# Patient Record
Sex: Female | Born: 1955 | Race: White | Hispanic: No | Marital: Married | Smoking: Former smoker
Health system: Southern US, Community
[De-identification: ages and names within clinical notes are randomized; demographics above are authoritative.]

## PROBLEM LIST (undated history)

## (undated) DIAGNOSIS — E119 Type 2 diabetes mellitus without complications: Secondary | ICD-10-CM

## (undated) DIAGNOSIS — K219 Gastro-esophageal reflux disease without esophagitis: Secondary | ICD-10-CM

## (undated) DIAGNOSIS — E785 Hyperlipidemia, unspecified: Secondary | ICD-10-CM

## (undated) DIAGNOSIS — T7840XA Allergy, unspecified, initial encounter: Secondary | ICD-10-CM

## (undated) DIAGNOSIS — M1990 Unspecified osteoarthritis, unspecified site: Secondary | ICD-10-CM

## (undated) HISTORY — DX: Hyperlipidemia, unspecified: E78.5

## (undated) HISTORY — DX: Type 2 diabetes mellitus without complications: E11.9

## (undated) HISTORY — PX: JOINT REPLACEMENT: SHX530

## (undated) HISTORY — DX: Allergy, unspecified, initial encounter: T78.40XA

## (undated) HISTORY — DX: Unspecified osteoarthritis, unspecified site: M19.90

## (undated) HISTORY — DX: Gastro-esophageal reflux disease without esophagitis: K21.9

---

## 2006-03-24 DIAGNOSIS — J309 Allergic rhinitis, unspecified: Secondary | ICD-10-CM | POA: Insufficient documentation

## 2006-05-13 ENCOUNTER — Ambulatory Visit: Payer: Self-pay | Admitting: Family Medicine

## 2006-05-22 DIAGNOSIS — E7800 Pure hypercholesterolemia, unspecified: Secondary | ICD-10-CM | POA: Insufficient documentation

## 2007-07-14 DIAGNOSIS — L989 Disorder of the skin and subcutaneous tissue, unspecified: Secondary | ICD-10-CM | POA: Insufficient documentation

## 2007-07-28 ENCOUNTER — Ambulatory Visit: Payer: Self-pay | Admitting: Family Medicine

## 2008-07-26 DIAGNOSIS — Z720 Tobacco use: Secondary | ICD-10-CM | POA: Insufficient documentation

## 2008-08-09 ENCOUNTER — Ambulatory Visit: Payer: Self-pay | Admitting: Family Medicine

## 2010-10-15 ENCOUNTER — Ambulatory Visit: Payer: Self-pay | Admitting: Family Medicine

## 2011-08-27 LAB — HM PAP SMEAR: HM Pap smear: NEGATIVE

## 2011-10-16 ENCOUNTER — Ambulatory Visit: Payer: Self-pay | Admitting: Family Medicine

## 2012-10-18 ENCOUNTER — Ambulatory Visit: Payer: Self-pay | Admitting: Family Medicine

## 2013-10-28 LAB — LIPID PANEL
CHOLESTEROL: 212 mg/dL — AB (ref 0–200)
HDL: 44 mg/dL (ref 35–70)
LDL CALC: 99 mg/dL
Triglycerides: 343 mg/dL — AB (ref 40–160)

## 2013-10-28 LAB — BASIC METABOLIC PANEL
BUN: 7 mg/dL (ref 4–21)
Creatinine: 0.9 mg/dL (ref 0.5–1.1)
Glucose: 104 mg/dL
POTASSIUM: 4.4 mmol/L (ref 3.4–5.3)
Sodium: 135 mmol/L — AB (ref 137–147)

## 2013-10-28 LAB — HEPATIC FUNCTION PANEL
ALK PHOS: 77 U/L (ref 25–125)
ALT: 36 U/L — AB (ref 7–35)
AST: 35 U/L (ref 13–35)
Bilirubin, Total: 0.6 mg/dL

## 2013-10-28 LAB — CBC AND DIFFERENTIAL
HCT: 41 % (ref 36–46)
Hemoglobin: 13.5 g/dL (ref 12.0–16.0)
NEUTROS ABS: 6 /uL
PLATELETS: 370 10*3/uL (ref 150–399)
WBC: 10.5 10^3/mL

## 2013-10-28 LAB — TSH: TSH: 1.11 u[IU]/mL (ref 0.41–5.90)

## 2013-10-28 LAB — HEMOGLOBIN A1C: HEMOGLOBIN A1C: 6.7 % — AB (ref 4.0–6.0)

## 2013-11-04 ENCOUNTER — Ambulatory Visit: Payer: Self-pay | Admitting: Family Medicine

## 2013-11-04 LAB — HM MAMMOGRAPHY

## 2014-08-07 ENCOUNTER — Other Ambulatory Visit: Payer: Self-pay | Admitting: Family Medicine

## 2014-08-07 DIAGNOSIS — E785 Hyperlipidemia, unspecified: Secondary | ICD-10-CM

## 2014-10-11 ENCOUNTER — Other Ambulatory Visit: Payer: Self-pay | Admitting: Family Medicine

## 2014-10-11 DIAGNOSIS — Z1231 Encounter for screening mammogram for malignant neoplasm of breast: Secondary | ICD-10-CM

## 2014-10-31 ENCOUNTER — Encounter: Payer: Self-pay | Admitting: Emergency Medicine

## 2014-10-31 ENCOUNTER — Ambulatory Visit (INDEPENDENT_AMBULATORY_CARE_PROVIDER_SITE_OTHER): Payer: BC Managed Care – PPO | Admitting: Family Medicine

## 2014-10-31 ENCOUNTER — Encounter: Payer: Self-pay | Admitting: Family Medicine

## 2014-10-31 ENCOUNTER — Other Ambulatory Visit: Payer: Self-pay | Admitting: Family Medicine

## 2014-10-31 VITALS — BP 126/86 | HR 78 | Temp 97.9°F | Resp 16 | Ht 66.0 in | Wt 235.0 lb

## 2014-10-31 DIAGNOSIS — R739 Hyperglycemia, unspecified: Secondary | ICD-10-CM | POA: Insufficient documentation

## 2014-10-31 DIAGNOSIS — G4700 Insomnia, unspecified: Secondary | ICD-10-CM | POA: Insufficient documentation

## 2014-10-31 DIAGNOSIS — E119 Type 2 diabetes mellitus without complications: Secondary | ICD-10-CM | POA: Insufficient documentation

## 2014-10-31 DIAGNOSIS — Z0000 Encounter for general adult medical examination without abnormal findings: Secondary | ICD-10-CM | POA: Diagnosis not present

## 2014-10-31 DIAGNOSIS — E7800 Pure hypercholesterolemia, unspecified: Secondary | ICD-10-CM | POA: Diagnosis not present

## 2014-10-31 DIAGNOSIS — IMO0002 Reserved for concepts with insufficient information to code with codable children: Secondary | ICD-10-CM | POA: Insufficient documentation

## 2014-10-31 DIAGNOSIS — E559 Vitamin D deficiency, unspecified: Secondary | ICD-10-CM | POA: Insufficient documentation

## 2014-10-31 LAB — POCT URINALYSIS DIPSTICK
Bilirubin, UA: NEGATIVE
Blood, UA: NEGATIVE
Glucose, UA: NEGATIVE
Ketones, UA: NEGATIVE
LEUKOCYTES UA: NEGATIVE
Nitrite, UA: NEGATIVE
PROTEIN UA: NEGATIVE
UROBILINOGEN UA: 0.2
pH, UA: 6

## 2014-10-31 LAB — POCT UA - MICROALBUMIN: MICROALBUMIN (UR) POC: NEGATIVE mg/L

## 2014-10-31 NOTE — Progress Notes (Signed)
Patient ID: Madison Mason, female   DOB: 11/04/1955, 59 y.o.   MRN: [id number]        Patient: Madison Mason, Female    DOB: 05/04/1955, 59 y.o.   MRN: [id number] Visit Date: 10/31/2014  Today's Provider: Lorie PhenixNancy Sharnika Binney, MD   Chief Complaint  Patient presents with  . Annual Exam   Subjective:    Annual physical exam Madison Mason is a 59 y.o. female who presents today for health maintenance and complete physical. She feels well. She reports exercising daily. She reports she is sleeping fairly well,6 hours wakes up every hour or 2. Does not think she snores.  Does get up also to urinate.   10/28/13 CPE 08/27/11 Pap-neg; HPV-neg 11/04/13 Mammo-BI-RADS 1 10/28/13 EKG    Results for orders placed or performed in visit on 10/31/14  POCT urinalysis dipstick  Result Value Ref Range   Color, UA straw    Clarity, UA clear    Glucose, UA neg    Bilirubin, UA neg    Ketones, UA neg    Spec Grav, UA <=1.005    Blood, UA neg    pH, UA 6.0    Protein, UA neg    Urobilinogen, UA 0.2    Nitrite, UA neg    Leukocytes, UA Negative Negative    Lab Results  Component Value Date   WBC 10.5 10/28/2013   HGB 13.5 10/28/2013   HCT 41 10/28/2013   PLT 370 10/28/2013   CHOL 212* 10/28/2013   TRIG 343* 10/28/2013   HDL 44 10/28/2013   LDLCALC 99 10/28/2013   ALT 36* 10/28/2013   AST 35 10/28/2013   NA 135* 10/28/2013   K 4.4 10/28/2013   CREATININE 0.9 10/28/2013   BUN 7 10/28/2013   TSH 1.11 10/28/2013   HGBA1C 6.7* 10/28/2013    -----------------------------------------------------------------  Diabetes Mellitus Type II, Follow-up:   Lab Results  Component Value Date   HGBA1C 6.7* 10/28/2013    Last seen for diabetes 1 years ago.  Management since then includes none. She reports fair compliance with treatment. Current symptoms include none and have been unchanged. Home blood sugar records: none  Episodes of hypoglycemia? no   Current Insulin Regimen:  none Most Recent Eye Exam: up to date Weight trend: increasing steadily Prior visit with dietician: no Current diet: in general, an "unhealthy" diet Current exercise: bicycling  Pertinent Labs:    Component Value Date/Time   CHOL 212* 10/28/2013   TRIG 343* 10/28/2013   CREATININE 0.9 10/28/2013    Wt Readings from Last 3 Encounters:  10/31/14 235 lb (106.595 kg)  10/28/13 223 lb (101.152 kg)    ------------------------------------------------------------------------     Review of Systems  Constitutional: Negative.   HENT: Negative.   Eyes: Negative.   Respiratory: Negative.   Cardiovascular: Negative.   Gastrointestinal: Negative.   Endocrine: Negative.   Genitourinary: Negative.   Musculoskeletal: Negative.   Skin: Negative.   Allergic/Immunologic: Positive for environmental allergies.  Neurological: Negative.   Hematological: Negative.   Psychiatric/Behavioral: Positive for sleep disturbance.    Social History She  reports that she quit smoking about 17 years ago. Her smoking use included Cigarettes. She has a 25 pack-year smoking history. She has never used smokeless tobacco. She reports that she drinks alcohol. She reports that she does not use illicit drugs. Social History   Social History  . Marital Status: Married    Spouse Name: N/A  . Number of Children: N/A  .  Years of Education: N/A   Social History Main Topics  . Smoking status: Former Smoker -- 1.00 packs/day for 25 years    Types: Cigarettes    Quit date: 01/05/1997  . Smokeless tobacco: Never Used     Comment: Quit in 1988  . Alcohol Use: Yes     Comment: occasionally  . Drug Use: No  . Sexual Activity: Not Asked   Other Topics Concern  . None   Social History Narrative    Patient Active Problem List   Diagnosis Date Noted  . Blood glucose elevated 10/31/2014  . Cannot sleep 10/31/2014  . Adult BMI 30+ 10/31/2014  . Avitaminosis D 10/31/2014  . Hyperlipemia 08/07/2014  .  Current tobacco use 07/26/2008  . Dermatologic disease 07/14/2007  . Hypercholesteremia 05/22/2006  . Allergic rhinitis 03/24/2006    Past Surgical History  Procedure Laterality Date  . Cesarean section  1989 and 1986    Family History  Family Status  Relation Status Death Age  . Father Alive   . Mother Alive   . Sister Alive   . Sister Alive   . Sister Alive    Her family history includes Cancer in her father; Diabetes in her mother; Healthy in her sister, sister, and sister; Heart disease in her father.    Allergies  Allergen Reactions  . Sulfa Antibiotics Rash    Previous Medications   CHOLECALCIFEROL (VITAMIN D) 1000 UNITS TABLET    Take 1,000 Units by mouth daily.   FAMOTIDINE (PEPCID) 10 MG TABLET    Take 10 mg by mouth daily.   LORATADINE-PSEUDOEPHEDRINE (CLARITIN-D 12 HOUR) 5-120 MG TABLET    CLARITIN-D 12 HOUR, 5-120MG  (Oral Tablet Extended Release 12 Hour)  1 Every Day for 0 days  Quantity: 0.00;  Refills: 0   Ordered :21-Aug-2010  Kavin Leech ;  Started 13-Aug-2009 Active   MISC NATURAL PRODUCTS (OSTEO BI-FLEX ADV TRIPLE ST) TABS    Take 1 tablet by mouth daily.   MULTIPLE VITAMINS-MINERALS PO    Take 1 tablet by mouth daily.    SIMVASTATIN (ZOCOR) 20 MG TABLET    TAKE ONE TABLET BY MOUTH AT BEDTIME    No care team member to display     Objective:   Vitals: BP 126/86 mmHg  Pulse 78  Temp(Src) 97.9 F (36.6 C) (Oral)  Resp 16  Ht  (1.676 m)  Wt 235 lb (106.595 kg)  BMI 37.95 kg/m2  SpO2 98%   Physical Exam  Constitutional: She is oriented to person, place, and time. She appears well-developed and well-nourished.  HENT:  Head: Normocephalic and atraumatic.  Right Ear: Tympanic membrane, external ear and ear canal normal.  Left Ear: Tympanic membrane, external ear and ear canal normal.  Nose: Nose normal.  Mouth/Throat: Uvula is midline, oropharynx is clear and moist and mucous membranes are normal.  Eyes: Conjunctivae, EOM and lids are  normal. Pupils are equal, round, and reactive to light.  Neck: Trachea normal and normal range of motion. Neck supple. Carotid bruit is not present. No thyroid mass and no thyromegaly present.  Cardiovascular: Normal rate, regular rhythm and normal heart sounds.   Pulmonary/Chest: Effort normal and breath sounds normal.  Abdominal: Soft. Normal appearance and bowel sounds are normal. There is no hepatosplenomegaly. There is no tenderness.  Genitourinary: No breast swelling, tenderness or discharge.  Musculoskeletal: Normal range of motion.  Lymphadenopathy:    She has no cervical adenopathy.    She has no axillary adenopathy.  Neurological: She is alert and oriented to person, place, and time. She has normal strength. No cranial nerve deficit.  Skin: Skin is warm, dry and intact.  Psychiatric: She has a normal mood and affect. Her speech is normal and behavior is normal. Judgment and thought content normal. Cognition and memory are normal.     Depression Screen PHQ 2/9 Scores 10/31/2014  PHQ - 2 Score 0      Assessment & Plan:     Routine Health Maintenance and Physical Exam  Exercise Activities and Dietary recommendations Goals    . Exercise 150 minutes per week (moderate activity)       Immunization History  Administered Date(s) Administered  . Tdap 07/14/2007    Health Maintenance  Topic Date Due  . Hepatitis C Screening  15-Sep-1955  . HIV Screening  12/09/1972  . TETANUS/TDAP  12/09/1976  . PAP SMEAR  12/10/1978  . MAMMOGRAM  12/10/2007  . COLONOSCOPY  12/10/2007  . INFLUENZA VACCINE  08/07/2014       1. Annual physical exam Stable. Patient advised to continue eating healthy and exercise daily. - POCT urinalysis dipstick  2. Diabetes mellitus without complication (HCC) Not to goal. F/U pending lab report. Will most likely start Metformin over the phone. Discussed medication and side effects today.  Work on [employer] and exercise and recheck in 3 monts.   - Hemoglobin A1c - TSH - POCT UA - Microalbumin  3. Hypercholesteremia F/U pending lab report. - Comprehensive metabolic panel - Lipid Panel With LDL/HDL Ratio - CBC with Differential/Platelet    Patient seen and examined by Dr. Leo Grosser, and note scribed by Liz Beach. Dimas, CMA.  I have reviewed the document for accuracy and completeness and I agree with above. Leo Grosser, MD    Lorie Phenix, MD   --------------------------------------------------------------------

## 2014-10-31 NOTE — Patient Instructions (Signed)
Please consider joining a formal weight program.

## 2014-11-01 LAB — COMPREHENSIVE METABOLIC PANEL
A/G RATIO: 1.5 (ref 1.1–2.5)
ALBUMIN: 4.6 g/dL (ref 3.5–5.5)
ALK PHOS: 75 IU/L (ref 39–117)
ALT: 62 IU/L — ABNORMAL HIGH (ref 0–32)
AST: 85 IU/L — ABNORMAL HIGH (ref 0–40)
BUN / CREAT RATIO: 17 (ref 9–23)
BUN: 14 mg/dL (ref 6–24)
Bilirubin Total: 0.6 mg/dL (ref 0.0–1.2)
CO2: 25 mmol/L (ref 18–29)
CREATININE: 0.82 mg/dL (ref 0.57–1.00)
Calcium: 10.4 mg/dL — ABNORMAL HIGH (ref 8.7–10.2)
Chloride: 95 mmol/L — ABNORMAL LOW (ref 97–106)
GFR calc Af Amer: 93 mL/min/{1.73_m2} (ref >59)
GFR, EST NON AFRICAN AMERICAN: 80 mL/min/{1.73_m2} (ref >59)
GLOBULIN, TOTAL: 3.1 g/dL (ref 1.5–4.5)
Glucose: 131 mg/dL — ABNORMAL HIGH (ref 65–99)
Potassium: 4.7 mmol/L (ref 3.5–5.2)
SODIUM: 137 mmol/L (ref 136–144)
Total Protein: 7.7 g/dL (ref 6.0–8.5)

## 2014-11-01 LAB — CBC WITH DIFFERENTIAL/PLATELET
BASOS: 1 %
Basophils Absolute: 0.1 10*3/uL (ref 0.0–0.2)
EOS (ABSOLUTE): 0.2 10*3/uL (ref 0.0–0.4)
EOS: 2 %
HEMATOCRIT: 40.7 % (ref 34.0–46.6)
HEMOGLOBIN: 13.6 g/dL (ref 11.1–15.9)
Immature Grans (Abs): 0 10*3/uL (ref 0.0–0.1)
Immature Granulocytes: 0 %
LYMPHS ABS: 3.9 10*3/uL — AB (ref 0.7–3.1)
Lymphs: 43 %
MCH: 30.4 pg (ref 26.6–33.0)
MCHC: 33.4 g/dL (ref 31.5–35.7)
MCV: 91 fL (ref 79–97)
Monocytes Absolute: 0.6 10*3/uL (ref 0.1–0.9)
Monocytes: 6 %
NEUTROS ABS: 4.4 10*3/uL (ref 1.4–7.0)
Neutrophils: 48 %
Platelets: 353 10*3/uL (ref 150–379)
RBC: 4.48 x10E6/uL (ref 3.77–5.28)
RDW: 13 % (ref 12.3–15.4)
WBC: 9.1 10*3/uL (ref 3.4–10.8)

## 2014-11-01 LAB — HEMOGLOBIN A1C
ESTIMATED AVERAGE GLUCOSE: 174 mg/dL
Hgb A1c MFr Bld: 7.7 % — ABNORMAL HIGH (ref 4.8–5.6)

## 2014-11-01 LAB — LIPID PANEL WITH LDL/HDL RATIO
CHOLESTEROL TOTAL: 216 mg/dL — AB (ref 100–199)
HDL: 47 mg/dL (ref >39)
LDL CALC: 116 mg/dL — AB (ref 0–99)
LDL/HDL RATIO: 2.5 ratio (ref 0.0–3.2)
Triglycerides: 264 mg/dL — ABNORMAL HIGH (ref 0–149)
VLDL Cholesterol Cal: 53 mg/dL — ABNORMAL HIGH (ref 5–40)

## 2014-11-01 LAB — TSH: TSH: 1.87 u[IU]/mL (ref 0.450–4.500)

## 2014-11-02 ENCOUNTER — Telehealth: Payer: Self-pay

## 2014-11-02 DIAGNOSIS — E119 Type 2 diabetes mellitus without complications: Secondary | ICD-10-CM

## 2014-11-02 MED ORDER — METFORMIN HCL 500 MG PO TABS: 500.0000 mg | ORAL_TABLET | Freq: Two times a day (BID) | ORAL | Status: DC

## 2014-11-02 NOTE — Telephone Encounter (Signed)
Sent rx.  Labs in 4 weeks and then will decide after that  on ov or wait 3 months.   Thanks.

## 2014-11-02 NOTE — Telephone Encounter (Signed)
Pt advised.  She would like to go ahead and just start a diabetes medication now.  She said that you were going to call one in if her sugar was too high.  Do you still want to see her in four weeks, or can she just get her labs redrawn?  Pharmacy [facility] in [place].   Thanks,   -Vernona RiegerLaura

## 2014-11-02 NOTE — Telephone Encounter (Signed)
-----   Message from Lorie PhenixNancy Maloney, MD sent at 11/01/2014  4:41 PM EDT ----- Blood sugar too high, liver enzymes up and calcium elevated.  Needs ov to address.  Can try to work on diet and exercise and recheck labs in 4 weeks before ov and see if liver improves. May be fatty liver. Important to recheck in follow up. Thanks.

## 2014-11-06 ENCOUNTER — Ambulatory Visit
Admission: RE | Admit: 2014-11-06 | Discharge: 2014-11-06 | Disposition: A | Discharge status: Home / Self Care | Payer: BC Managed Care – PPO | Source: Ambulatory Visit | Attending: Family Medicine | Admitting: Family Medicine

## 2014-11-06 DIAGNOSIS — Z1231 Encounter for screening mammogram for malignant neoplasm of breast: Secondary | ICD-10-CM | POA: Diagnosis present

## 2014-12-29 LAB — HM DIABETES EYE EXAM

## 2015-01-26 ENCOUNTER — Ambulatory Visit (INDEPENDENT_AMBULATORY_CARE_PROVIDER_SITE_OTHER): Payer: BC Managed Care – PPO | Admitting: Family Medicine

## 2015-01-26 ENCOUNTER — Encounter: Payer: Self-pay | Admitting: Family Medicine

## 2015-01-26 VITALS — BP 124/70 | HR 80 | Temp 98.6°F | Resp 16 | Wt 228.0 lb

## 2015-01-26 DIAGNOSIS — E119 Type 2 diabetes mellitus without complications: Secondary | ICD-10-CM | POA: Diagnosis not present

## 2015-01-26 DIAGNOSIS — E785 Hyperlipidemia, unspecified: Secondary | ICD-10-CM | POA: Diagnosis not present

## 2015-01-26 DIAGNOSIS — R748 Abnormal levels of other serum enzymes: Secondary | ICD-10-CM | POA: Diagnosis not present

## 2015-01-26 LAB — POCT UA - MICROALBUMIN: MICROALBUMIN (UR) POC: 20 mg/L

## 2015-01-26 LAB — POCT GLYCOSYLATED HEMOGLOBIN (HGB A1C): Hemoglobin A1C: 7.1

## 2015-01-26 MED ORDER — SIMVASTATIN 20 MG PO TABS: 20.0000 mg | ORAL_TABLET | Freq: Every day | ORAL | Status: DC

## 2015-01-26 MED ORDER — METFORMIN HCL 1000 MG PO TABS: 1000.0000 mg | ORAL_TABLET | Freq: Two times a day (BID) | ORAL | Status: DC

## 2015-01-26 NOTE — Addendum Note (Signed)
Addended by: Leo Grosser on: 01/26/2015 11:18 AM   Modules accepted: Kipp Brood

## 2015-01-26 NOTE — Progress Notes (Addendum)
Patient ID: Madison Mason, female   DOB: [birth year], 60 y.o.   MRN: [id number]         Patient: Madison Mason Female    DOB: 06-Jan-1956   60 y.o.   MRN: [id number] Visit Date: 01/26/2015  Today's Provider: Lorie Phenix, MD   Chief Complaint  Patient presents with  . Hyperlipidemia  . Diabetes   Subjective:    Hyperlipidemia This is a chronic problem. The problem is controlled. Recent lipid tests were reviewed and are high. Exacerbating diseases include diabetes. Pertinent negatives include no chest pain. Current antihyperlipidemic treatment includes statins. There are no compliance problems.  Risk factors for coronary artery disease include diabetes mellitus, dyslipidemia and obesity.  Diabetes She presents for her follow-up diabetic visit. She has type 2 diabetes mellitus. Her disease course has been stable. There are no hypoglycemic associated symptoms. Pertinent negatives for hypoglycemia include no dizziness, headaches, mood changes, nervousness/anxiousness, seizures, sleepiness, speech difficulty, sweats or tremors. Pertinent negatives for diabetes include no blurred vision, no chest pain, no fatigue, no foot paresthesias, no foot ulcerations, no polydipsia, no polyphagia, no polyuria, no visual change, no weakness and no weight loss. Symptoms are stable. Risk factors for coronary artery disease include dyslipidemia, diabetes mellitus and obesity. Current diabetic treatment includes oral agent (monotherapy). She is compliant with treatment all of the time. Her weight is stable. She does not see a podiatrist.Eye exam is current.   Lab Results  Component Value Date   HGBA1C 7.7* 10/31/2014   Lab Results  Component Value Date   CHOL 216* 10/31/2014   HDL 47 10/31/2014   LDLCALC 116* 10/31/2014   TRIG 264* 10/31/2014        Allergies  Allergen Reactions  . Sulfa Antibiotics Rash   Previous Medications   CHOLECALCIFEROL (VITAMIN D) 1000 UNITS TABLET    Take 1,000 Units  by mouth daily.   FAMOTIDINE (PEPCID) 10 MG TABLET    Take 10 mg by mouth daily.   LORATADINE-PSEUDOEPHEDRINE (CLARITIN-D 12 HOUR) 5-120 MG TABLET    CLARITIN-D 12 HOUR, 5-120MG  (Oral Tablet Extended Release 12 Hour)  1 Every Day for 0 days  Quantity: 0.00;  Refills: 0   Ordered :21-Aug-2010  Kavin Leech ;  Started 13-Aug-2009 Active   METFORMIN (GLUCOPHAGE) 500 MG TABLET    Take 1 tablet (500 mg total) by mouth 2 (two) times daily with a meal.   MISC NATURAL PRODUCTS (OSTEO BI-FLEX ADV TRIPLE ST) TABS    Take 1 tablet by mouth daily.   MULTIPLE VITAMINS-MINERALS PO    Take 1 tablet by mouth daily.     Review of Systems  Constitutional: Negative.  Negative for weight loss and fatigue.  Eyes: Negative for blurred vision.  Respiratory: Negative.   Cardiovascular: Negative.  Negative for chest pain.  Gastrointestinal: Negative.   Endocrine: Negative for polydipsia, polyphagia and polyuria.  Musculoskeletal: Negative.   Neurological: Negative for dizziness, tremors, seizures, speech difficulty, weakness, light-headedness and headaches.  Psychiatric/Behavioral: The patient is not nervous/anxious.     Social History  Substance Use Topics  . Smoking status: Former Smoker -- 1.00 packs/day for 25 years    Types: Cigarettes    Quit date: 01/05/1997  . Smokeless tobacco: Never Used     Comment: Quit in 1988  . Alcohol Use: Yes     Comment: occasionally   Objective:   BP 124/70 mmHg  Pulse 80  Temp(Src) 98.6 F (37 C) (Oral)  Resp 16  Wt 228 lb (  103.42 kg)  Results for orders placed or performed in visit on 01/26/15  POCT glycosylated hemoglobin (Hb A1C)  Result Value Ref Range   Hemoglobin A1C 7.1   POCT UA - Microalbumin  Result Value Ref Range   Microalbumin Ur, POC 20 mg/L    Physical Exam  Constitutional: She is oriented to person, place, and time. She appears well-developed and well-nourished.  Cardiovascular: Normal rate, regular rhythm and normal heart sounds.     Pulmonary/Chest: Effort normal and breath sounds normal.  Neurological: She is alert and oriented to person, place, and time.  Psychiatric: She has a normal mood and affect. Her behavior is normal. Judgment and thought content normal.        Assessment & Plan:       1. Diabetes mellitus without complication (HCC) A1C improved at 7.1%, but not to goal.  Pt tolerates Metformin well, will increase to 1,000 mg twice a day.  Recheck in three months.  Will consider Pneumonia shot.   - POCT glycosylated hemoglobin (Hb A1C) - POCT UA - Microalbumin - metFORMIN (GLUCOPHAGE) 1000 MG tablet; Take 1 tablet (1,000 mg total) by mouth 2 (two) times daily with a meal.  Dispense: 180 tablet; Refill: 1 Results for orders placed or performed in visit on 01/26/15  POCT glycosylated hemoglobin (Hb A1C)  Result Value Ref Range   Hemoglobin A1C 7.1   POCT UA - Microalbumin  Result Value Ref Range   Microalbumin Ur, POC 20 mg/L    2. Hyperlipemia Stable, refills provided.  - simvastatin (ZOCOR) 20 MG tablet; Take 1 tablet (20 mg total) by mouth at bedtime.  Dispense: 90 tablet; Refill: 1  3. Elevated liver enzymes Will recheck today.  - Comprehensive metabolic panel      Patient was seen and examined by Leo Grosser, MD, and note scribed by Kavin Leech, CMA.  I have reviewed the document for accuracy and completeness and I agree with above. - Leo Grosser, MD    Lorie Phenix, MD  [facility]

## 2015-01-27 LAB — COMPREHENSIVE METABOLIC PANEL
A/G RATIO: 1.5 (ref 1.1–2.5)
ALT: 46 IU/L — AB (ref 0–32)
AST: 64 IU/L — AB (ref 0–40)
Albumin: 4.6 g/dL (ref 3.5–5.5)
Alkaline Phosphatase: 70 IU/L (ref 39–117)
BILIRUBIN TOTAL: 0.3 mg/dL (ref 0.0–1.2)
BUN/Creatinine Ratio: 17 (ref 9–23)
BUN: 12 mg/dL (ref 6–24)
CHLORIDE: 101 mmol/L (ref 96–106)
CO2: 25 mmol/L (ref 18–29)
Calcium: 10 mg/dL (ref 8.7–10.2)
Creatinine, Ser: 0.7 mg/dL (ref 0.57–1.00)
GFR calc Af Amer: 111 mL/min/{1.73_m2} (ref >59)
GFR calc non Af Amer: 96 mL/min/{1.73_m2} (ref >59)
Globulin, Total: 3 g/dL (ref 1.5–4.5)
Glucose: 111 mg/dL — ABNORMAL HIGH (ref 65–99)
POTASSIUM: 4.9 mmol/L (ref 3.5–5.2)
Sodium: 142 mmol/L (ref 134–144)
Total Protein: 7.6 g/dL (ref 6.0–8.5)

## 2015-01-29 ENCOUNTER — Ambulatory Visit: Payer: BC Managed Care – PPO | Admitting: Family Medicine

## 2015-01-29 ENCOUNTER — Telehealth: Payer: Self-pay

## 2015-01-29 NOTE — Telephone Encounter (Signed)
Advised pt of lab results. Pt refuses GI referral at this time. Please advise. Allene Dillon, CMA

## 2015-01-29 NOTE — Telephone Encounter (Signed)
-----   Message from Lorie Phenix, MD sent at 01/27/2015  9:36 AM EST ----- Liver enzymes still elevated. Not terribly high, but enough to warrant a work up.   Recommend referral to GI to evaluate and treat.   Thanks.

## 2015-01-29 NOTE — Telephone Encounter (Signed)
Recheck met c in one month. Thanks.

## 2015-01-30 NOTE — Telephone Encounter (Signed)
Pt advised.   Thanks,   -Timera Windt  

## 2015-01-30 NOTE — Telephone Encounter (Signed)
LMTCB 01/30/2015  Thanks,   -Tierre Gerard  

## 2015-04-27 ENCOUNTER — Ambulatory Visit (INDEPENDENT_AMBULATORY_CARE_PROVIDER_SITE_OTHER): Payer: BC Managed Care – PPO | Admitting: Family Medicine

## 2015-04-27 ENCOUNTER — Encounter: Payer: Self-pay | Admitting: Family Medicine

## 2015-04-27 VITALS — BP 122/78 | Temp 97.7°F | Resp 16 | Wt 220.0 lb

## 2015-04-27 DIAGNOSIS — E119 Type 2 diabetes mellitus without complications: Secondary | ICD-10-CM

## 2015-04-27 DIAGNOSIS — Z23 Encounter for immunization: Secondary | ICD-10-CM | POA: Diagnosis not present

## 2015-04-27 DIAGNOSIS — E7800 Pure hypercholesterolemia, unspecified: Secondary | ICD-10-CM

## 2015-04-27 DIAGNOSIS — E785 Hyperlipidemia, unspecified: Secondary | ICD-10-CM

## 2015-04-27 DIAGNOSIS — R945 Abnormal results of liver function studies: Secondary | ICD-10-CM

## 2015-04-27 DIAGNOSIS — R7989 Other specified abnormal findings of blood chemistry: Secondary | ICD-10-CM | POA: Diagnosis not present

## 2015-04-27 LAB — POCT GLYCOSYLATED HEMOGLOBIN (HGB A1C)
ESTIMATED AVERAGE GLUCOSE: 137
Hemoglobin A1C: 6.4

## 2015-04-27 MED ORDER — METFORMIN HCL 1000 MG PO TABS: 1000.0000 mg | ORAL_TABLET | Freq: Two times a day (BID) | ORAL | Status: DC

## 2015-04-27 MED ORDER — SIMVASTATIN 20 MG PO TABS: 20.0000 mg | ORAL_TABLET | Freq: Every day | ORAL | Status: DC

## 2015-04-27 NOTE — Progress Notes (Signed)
Patient ID: Madison Mason, female   DOB: 08/01/1955, 60 y.o.   MRN: [id number]       Patient: Madison Mason Female    DOB: 03/12/1955   60 y.o.   MRN: [id number] Visit Date: 04/27/2015  Today's Provider: Lorie PhenixNancy Stephania Macfarlane, MD   Chief Complaint  Patient presents with  . Diabetes  . Hyperlipidemia   Subjective:    Diabetes She presents for her follow-up diabetic visit. She has type 2 diabetes mellitus. Her disease course has been stable. There are no hypoglycemic associated symptoms. There are no diabetic associated symptoms. Pertinent negatives for diabetes include no chest pain. There are no hypoglycemic complications. Symptoms are stable. Current diabetic treatment includes oral agent (monotherapy). She is compliant with treatment all of the time. Her weight is decreasing steadily (patient has lost 8lbs since last OV).  Hyperlipidemia Pertinent negatives include no chest pain, focal sensory loss, focal weakness, leg pain, myalgias or shortness of breath. Current antihyperlipidemic treatment includes statins. There are no compliance problems.    Elevated Liver Enzymes Patient reports that she declined going to see a GI doctor to have this evaluated.     Allergies  Allergen Reactions  . Sulfa Antibiotics Rash   Previous Medications   CHOLECALCIFEROL (VITAMIN D) 1000 UNITS TABLET    Take 1,000 Units by mouth daily.   FAMOTIDINE (PEPCID) 10 MG TABLET    Take 10 mg by mouth daily.   LORATADINE-PSEUDOEPHEDRINE (CLARITIN-D 12 HOUR) 5-120 MG TABLET    CLARITIN-D 12 HOUR, 5-120MG  (Oral Tablet Extended Release 12 Hour)  1 Every Day for 0 days  Quantity: 0.00;  Refills: 0   Ordered :21-Aug-2010  Kavin LeechWalsh, Laura ;  Started 13-Aug-2009 Active   MISC NATURAL PRODUCTS (OSTEO BI-FLEX ADV TRIPLE ST) TABS    Take 1 tablet by mouth daily.   MULTIPLE VITAMINS-MINERALS PO    Take 1 tablet by mouth daily.     Review of Systems  Constitutional: Negative.   Respiratory: Negative.  Negative for  shortness of breath.   Cardiovascular: Negative.  Negative for chest pain.  Endocrine: Negative.   Musculoskeletal: Negative.  Negative for myalgias.  Neurological: Negative for focal weakness.    Social History  Substance Use Topics  . Smoking status: Former Smoker -- 1.00 packs/day for 25 years    Types: Cigarettes    Quit date: 01/05/1997  . Smokeless tobacco: Never Used     Comment: Quit in 1988  . Alcohol Use: Yes     Comment: occasionally   Objective:   BP 122/78 mmHg  Temp(Src) 97.7 F (36.5 C)  Resp 16  Wt 220 lb (99.791 kg)  Physical Exam  Constitutional: She is oriented to person, place, and time. She appears well-developed and well-nourished.  HENT:  Head: Normocephalic and atraumatic.  Right Ear: External ear normal.  Left Ear: External ear normal.  Cardiovascular: Normal rate, regular rhythm and normal heart sounds.   Pulmonary/Chest: Effort normal and breath sounds normal.  Neurological: She is alert and oriented to person, place, and time.  Psychiatric: She has a normal mood and affect. Her behavior is normal. Judgment and thought content normal.        Assessment & Plan:     1. Diabetes mellitus without complication (HCC) Improved. Continue diet and exercise. - POCT glycosylated hemoglobin (Hb A1C) - metFORMIN (GLUCOPHAGE) 1000 MG tablet; Take 1 tablet (1,000 mg total) by mouth 2 (two) times daily with a meal.  Dispense: 180 tablet; Refill: 3  2.  Elevated LFTs F/U pending labs.  - Comprehensive metabolic panel  3. Hyperlipemia Stable. Refill as below. Patient is to F/U with [facility] in the summer.  - simvastatin (ZOCOR) 20 MG tablet; Take 1 tablet (20 mg total) by mouth at bedtime.  Dispense: 90 tablet; Refill: 3  4. Need for pneumococcal vaccination Patient declined pneumonia vaccine. Explained benefits vs. risks to patient. Patient reports that she will consider in the future.      Patient was seen and examined by Leo Grosser, MD,  and scribed by Anson [place], CMA.  I have reviewed the document for accuracy and completeness and I agree with above. - Leo Grosser, MD   Lorie Phenix, MD  [facility]

## 2015-04-28 ENCOUNTER — Telehealth: Payer: Self-pay

## 2015-04-28 DIAGNOSIS — E875 Hyperkalemia: Secondary | ICD-10-CM

## 2015-04-28 LAB — COMPREHENSIVE METABOLIC PANEL
ALBUMIN: 4.6 g/dL (ref 3.5–5.5)
ALK PHOS: 72 IU/L (ref 39–117)
ALT: 45 IU/L — ABNORMAL HIGH (ref 0–32)
AST: 61 IU/L — ABNORMAL HIGH (ref 0–40)
Albumin/Globulin Ratio: 1.4 (ref 1.2–2.2)
BILIRUBIN TOTAL: 0.5 mg/dL (ref 0.0–1.2)
BUN / CREAT RATIO: 10 (ref 9–23)
BUN: 8 mg/dL (ref 6–24)
CHLORIDE: 96 mmol/L (ref 96–106)
CO2: 25 mmol/L (ref 18–29)
CREATININE: 0.81 mg/dL (ref 0.57–1.00)
Calcium: 10.8 mg/dL — ABNORMAL HIGH (ref 8.7–10.2)
GFR calc non Af Amer: 81 mL/min/{1.73_m2} (ref >59)
GFR, EST AFRICAN AMERICAN: 93 mL/min/{1.73_m2} (ref >59)
GLOBULIN, TOTAL: 3.2 g/dL (ref 1.5–4.5)
Glucose: 99 mg/dL (ref 65–99)
Potassium: 5.5 mmol/L — ABNORMAL HIGH (ref 3.5–5.2)
SODIUM: 139 mmol/L (ref 134–144)
TOTAL PROTEIN: 7.8 g/dL (ref 6.0–8.5)

## 2015-04-28 NOTE — Telephone Encounter (Signed)
Patient advised as below.  

## 2015-04-28 NOTE — Telephone Encounter (Signed)
-----   Message from Lorie PhenixNancy Maloney, MD sent at 04/28/2015  9:21 AM EDT ----- Potassium and calcium off. Unclear if lab error.  Please have patient stay well hydrated and recheck sometime this week. Thanks.

## 2015-08-27 ENCOUNTER — Ambulatory Visit (INDEPENDENT_AMBULATORY_CARE_PROVIDER_SITE_OTHER): Payer: BC Managed Care – PPO | Admitting: Physician Assistant

## 2015-08-27 ENCOUNTER — Encounter: Payer: Self-pay | Admitting: Physician Assistant

## 2015-08-27 VITALS — BP 112/78 | HR 58 | Temp 97.7°F | Resp 14 | Wt 221.8 lb

## 2015-08-27 DIAGNOSIS — R945 Abnormal results of liver function studies: Secondary | ICD-10-CM

## 2015-08-27 DIAGNOSIS — R7989 Other specified abnormal findings of blood chemistry: Secondary | ICD-10-CM

## 2015-08-27 DIAGNOSIS — E7800 Pure hypercholesterolemia, unspecified: Secondary | ICD-10-CM | POA: Diagnosis not present

## 2015-08-27 DIAGNOSIS — E119 Type 2 diabetes mellitus without complications: Secondary | ICD-10-CM

## 2015-08-27 NOTE — Patient Instructions (Signed)
Diabetes and Exercise Exercising regularly is important. It is not just about losing weight. It has many health benefits, such as:  Improving your overall fitness, flexibility, and endurance.  Increasing your bone density.  Helping with weight control.  Decreasing your body fat.  Increasing your muscle strength.  Reducing stress and tension.  Improving your overall health. People with diabetes who exercise gain additional benefits because exercise:  Reduces appetite.  Improves the body's use of blood sugar (glucose).  Helps lower or control blood glucose.  Decreases blood pressure.  Helps control blood lipids (such as cholesterol and triglycerides).  Improves the body's use of the hormone insulin by:  Increasing the body's insulin sensitivity.  Reducing the body's insulin needs.  Decreases the risk for heart disease because exercising:  Lowers cholesterol and triglycerides levels.  Increases the levels of good cholesterol (such as high-density lipoproteins [HDL]) in the body.  Lowers blood glucose levels. YOUR ACTIVITY PLAN  Choose an activity that you enjoy, and set realistic goals. To exercise safely, you should begin practicing any new physical activity slowly, and gradually increase the intensity of the exercise over time. Your health care provider or diabetes educator can help create an activity plan that works for you. General recommendations include:  Encouraging children to engage in at least 60 minutes of physical activity each day.  Stretching and performing strength training exercises, such as yoga or weight lifting, at least 2 times per week.  Performing a total of at least 150 minutes of moderate-intensity exercise each week, such as brisk walking or water aerobics.  Exercising at least 3 days per week, making sure you allow no more than 2 consecutive days to pass without exercising.  Avoiding long periods of inactivity (90 minutes or more). When you  have to spend an extended period of time sitting down, take frequent breaks to walk or stretch. RECOMMENDATIONS FOR EXERCISING WITH TYPE 1 OR TYPE 2 DIABETES   Check your blood glucose before exercising. If blood glucose levels are greater than 240 mg/dL, check for urine ketones. Do not exercise if ketones are present.  Avoid injecting insulin into areas of the body that are going to be exercised. For example, avoid injecting insulin into:  The arms when playing tennis.  The legs when jogging.  Keep a record of:  Food intake before and after you exercise.  Expected peak times of insulin action.  Blood glucose levels before and after you exercise.  The type and amount of exercise you have done.  Review your records with your health care provider. Your health care provider will help you to develop guidelines for adjusting food intake and insulin amounts before and after exercising.  If you take insulin or oral hypoglycemic agents, watch for signs and symptoms of hypoglycemia. They include:  Dizziness.  Shaking.  Sweating.  Chills.  Confusion.  Drink plenty of water while you exercise to prevent dehydration or heat stroke. Body water is lost during exercise and must be replaced.  Talk to your health care provider before starting an exercise program to make sure it is safe for you. Remember, almost any type of activity is better than none.   This information is not intended to replace advice given to you by your health care provider. Make sure you discuss any questions you have with your health care provider.   Document Released: 03/15/2003 Document Revised: 05/09/2014 Document Reviewed: 06/01/2012 Elsevier Interactive Patient Education 2016 [employer]  

## 2015-08-27 NOTE — Progress Notes (Signed)
Patient: Madison Mason Female    DOB: 08/25/1955   60 y.o.   MRN: [id number] Visit Date: 08/27/2015  Today's Provider: Margaretann LovelessJennifer M Burnette, PA-C   Chief Complaint  Patient presents with  . Hyperlipidemia  . Diabetes  . Follow-up   Subjective:    HPI  Diabetes Mellitus Type II, Follow-up:   Lab Results  Component Value Date   HGBA1C 6.4 04/27/2015   HGBA1C 7.1 01/26/2015   HGBA1C 7.7 (H) 10/31/2014   Last seen for diabetes 4 months ago.  Management since then includes continue Metformin 1000 mg. She reports good compliance with treatment. She is not having side effects.  Current symptoms include none and have been stable. Home blood sugar records: not being checked     Current Insulin Regimen: none Weight trend: stable Current diet: well balanced Current exercise: cardiovascular workout on exercise equipment and walking  ------------------------------------------------------------------------    Lipid/Cholesterol, Follow-up:   Last seen for this 4 months ago.  Management since that visit includes continue Simvastatin 20 mg.  Last Lipid Panel:    Component Value Date/Time   CHOL 216 (H) 10/31/2014 1127   TRIG 264 (H) 10/31/2014 1127   HDL 47 10/31/2014 1127   LDLCALC 116 (H) 10/31/2014 1127    She reports good compliance with treatment. She is not having side effects.   Wt Readings from Last 3 Encounters:  08/27/15 221 lb 12.8 oz (100.6 kg)  04/27/15 220 lb (99.8 kg)  01/26/15 228 lb (103.4 kg)    ------------------------------------------------------------------------    Previous Medications   CHOLECALCIFEROL (VITAMIN D) 1000 UNITS TABLET    Take 1,000 Units by mouth daily.   FAMOTIDINE (PEPCID) 10 MG TABLET    Take 10 mg by mouth daily.   LORATADINE-PSEUDOEPHEDRINE (CLARITIN-D 12 HOUR) 5-120 MG TABLET    CLARITIN-D 12 HOUR, 5-120MG  (Oral Tablet Extended Release 12 Hour)  1 Every Day for 0 days  Quantity: 0.00;  Refills: 0   Ordered  :21-Aug-2010  Kavin LeechWalsh, Laura ;  Started 13-Aug-2009 Active   METFORMIN (GLUCOPHAGE) 1000 MG TABLET    Take 1 tablet (1,000 mg total) by mouth 2 (two) times daily with a meal.   MISC NATURAL PRODUCTS (OSTEO BI-FLEX ADV TRIPLE ST) TABS    Take 1 tablet by mouth daily.   MULTIPLE VITAMINS-MINERALS PO    Take 1 tablet by mouth daily.    SIMVASTATIN (ZOCOR) 20 MG TABLET    Take 1 tablet (20 mg total) by mouth at bedtime.    Review of Systems  Constitutional: Negative.   Respiratory: Negative.   Cardiovascular: Negative.   Gastrointestinal: Negative.   Psychiatric/Behavioral: Negative.     Social History  Substance Use Topics  . Smoking status: Former Smoker    Packs/day: 1.00    Years: 25.00    Types: Cigarettes    Quit date: 01/05/1997  . Smokeless tobacco: Never Used     Comment: Quit in 1988  . Alcohol use Yes     Comment: occasionally   Objective:   BP 112/78 (BP Location: Right Arm, Patient Position: Sitting, Cuff Size: Large)   Pulse (!) 58   Temp 97.7 F (36.5 C) (Oral)   Resp 14   Wt 221 lb 12.8 oz (100.6 kg)   BMI 35.80 kg/m   Physical Exam  Constitutional: She appears well-developed and well-nourished. No distress.  Neck: Normal range of motion. Neck supple. No JVD present. No tracheal deviation present. No thyromegaly present.  Cardiovascular: Normal rate, regular  rhythm and normal heart sounds.  Exam reveals no gallop and no friction rub.   No murmur heard. Pulmonary/Chest: Effort normal and breath sounds normal. No respiratory distress. She has no wheezes. She has no rales.  Musculoskeletal: She exhibits no edema.  Lymphadenopathy:    She has no cervical adenopathy.  Skin: She is not diaphoretic.  Vitals reviewed.     Assessment & Plan:     1. Diabetes mellitus without complication (HCC) Continue metformin 1000mg  BID. Will recheck HgBA1c as below. I will f/u pending results. If stable will see her back in late October 2017 for her CPE. She is to call if she  has any issues in the meantime.  - CBC with Differential - HgB A1c - Comprehensive Metabolic Panel (CMET)  2. Hypercholesteremia Continue simvastatin 20mg . Will check labs as below and f/u pending results. - Lipid panel  3. Elevated LFTs Previously elevated. At last check she denied wanting work up. No imaging done. Will recheck and f/u pending labs. If stable patient may continue to monitor labs. If increased she states she may agree to work up.  - Comprehensive Metabolic Panel (CMET)   Follow up: No Follow-up on file.

## 2015-08-28 ENCOUNTER — Telehealth: Payer: Self-pay

## 2015-08-28 LAB — CBC WITH DIFFERENTIAL/PLATELET
BASOS: 1 %
Basophils Absolute: 0 10*3/uL (ref 0.0–0.2)
EOS (ABSOLUTE): 0.3 10*3/uL (ref 0.0–0.4)
EOS: 3 %
Hematocrit: 38.8 % (ref 34.0–46.6)
Hemoglobin: 13 g/dL (ref 11.1–15.9)
IMMATURE GRANS (ABS): 0 10*3/uL (ref 0.0–0.1)
IMMATURE GRANULOCYTES: 0 %
LYMPHS: 40 %
Lymphocytes Absolute: 3.5 10*3/uL — ABNORMAL HIGH (ref 0.7–3.1)
MCH: 30.1 pg (ref 26.6–33.0)
MCHC: 33.5 g/dL (ref 31.5–35.7)
MCV: 90 fL (ref 79–97)
Monocytes Absolute: 0.4 10*3/uL (ref 0.1–0.9)
Monocytes: 4 %
NEUTROS PCT: 52 %
Neutrophils Absolute: 4.5 10*3/uL (ref 1.4–7.0)
PLATELETS: 327 10*3/uL (ref 150–379)
RBC: 4.32 x10E6/uL (ref 3.77–5.28)
RDW: 13.8 % (ref 12.3–15.4)
WBC: 8.8 10*3/uL (ref 3.4–10.8)

## 2015-08-28 LAB — COMPREHENSIVE METABOLIC PANEL
ALK PHOS: 61 IU/L (ref 39–117)
ALT: 35 IU/L — AB (ref 0–32)
AST: 38 IU/L (ref 0–40)
Albumin/Globulin Ratio: 1.5 (ref 1.2–2.2)
Albumin: 4.4 g/dL (ref 3.5–5.5)
BUN/Creatinine Ratio: 11 (ref 9–23)
BUN: 8 mg/dL (ref 6–24)
Bilirubin Total: 0.5 mg/dL (ref 0.0–1.2)
CALCIUM: 10.3 mg/dL — AB (ref 8.7–10.2)
CO2: 24 mmol/L (ref 18–29)
CREATININE: 0.76 mg/dL (ref 0.57–1.00)
Chloride: 96 mmol/L (ref 96–106)
GFR calc Af Amer: 101 mL/min/{1.73_m2} (ref >59)
GFR calc non Af Amer: 87 mL/min/{1.73_m2} (ref >59)
GLUCOSE: 100 mg/dL — AB (ref 65–99)
Globulin, Total: 2.9 g/dL (ref 1.5–4.5)
Potassium: 4.7 mmol/L (ref 3.5–5.2)
SODIUM: 138 mmol/L (ref 134–144)
Total Protein: 7.3 g/dL (ref 6.0–8.5)

## 2015-08-28 LAB — LIPID PANEL
CHOL/HDL RATIO: 3.8 ratio (ref 0.0–4.4)
Cholesterol, Total: 200 mg/dL — ABNORMAL HIGH (ref 100–199)
HDL: 53 mg/dL (ref >39)
LDL CALC: 82 mg/dL (ref 0–99)
Triglycerides: 325 mg/dL — ABNORMAL HIGH (ref 0–149)
VLDL CHOLESTEROL CAL: 65 mg/dL — AB (ref 5–40)

## 2015-08-28 LAB — HEMOGLOBIN A1C
Est. average glucose Bld gHb Est-mCnc: 137 mg/dL
Hgb A1c MFr Bld: 6.4 % — ABNORMAL HIGH (ref 4.8–5.6)

## 2015-08-28 NOTE — Telephone Encounter (Signed)
Pt advised.   Thanks,   -Cord Wilczynski  

## 2015-08-28 NOTE — Telephone Encounter (Signed)
-----   Message from Margaretann LovelessJennifer M Burnette, PA-C sent at 08/28/2015  9:04 AM EDT ----- HgBA1c is stable at 6.4. Cholesterol is fairly stable but triglycerides are up to 325 from 264. This is most closely related to diet so make sure to limit fatty foods, fried foods, and carbs. LFTs have improved from previous. Will continue to monitor.

## 2015-10-04 ENCOUNTER — Other Ambulatory Visit: Payer: Self-pay | Admitting: Physician Assistant

## 2015-10-04 DIAGNOSIS — Z1231 Encounter for screening mammogram for malignant neoplasm of breast: Secondary | ICD-10-CM

## 2015-11-08 ENCOUNTER — Ambulatory Visit
Admission: RE | Admit: 2015-11-08 | Discharge: 2015-11-08 | Disposition: A | Discharge status: Home / Self Care | Payer: BC Managed Care – PPO | Source: Ambulatory Visit | Attending: Physician Assistant | Admitting: Physician Assistant

## 2015-11-08 DIAGNOSIS — Z1231 Encounter for screening mammogram for malignant neoplasm of breast: Secondary | ICD-10-CM | POA: Diagnosis present

## 2015-11-09 ENCOUNTER — Telehealth: Payer: Self-pay

## 2015-11-09 NOTE — Telephone Encounter (Signed)
Patient advised as below.  

## 2015-11-09 NOTE — Telephone Encounter (Signed)
-----   Message from Margaretann LovelessJennifer M Burnette, PA-C sent at 11/09/2015  8:43 AM EDT ----- Normal mammogram. Repeat screening in one year.

## 2015-11-15 ENCOUNTER — Ambulatory Visit (INDEPENDENT_AMBULATORY_CARE_PROVIDER_SITE_OTHER): Payer: BC Managed Care – PPO | Admitting: Physician Assistant

## 2015-11-15 ENCOUNTER — Encounter: Payer: Self-pay | Admitting: Physician Assistant

## 2015-11-15 VITALS — BP 110/70 | HR 83 | Temp 97.5°F | Resp 16 | Wt 215.8 lb

## 2015-11-15 DIAGNOSIS — E119 Type 2 diabetes mellitus without complications: Secondary | ICD-10-CM | POA: Diagnosis not present

## 2015-11-15 DIAGNOSIS — Z1231 Encounter for screening mammogram for malignant neoplasm of breast: Secondary | ICD-10-CM | POA: Diagnosis not present

## 2015-11-15 DIAGNOSIS — Z1239 Encounter for other screening for malignant neoplasm of breast: Secondary | ICD-10-CM

## 2015-11-15 DIAGNOSIS — Z1159 Encounter for screening for other viral diseases: Secondary | ICD-10-CM | POA: Diagnosis not present

## 2015-11-15 DIAGNOSIS — Z124 Encounter for screening for malignant neoplasm of cervix: Secondary | ICD-10-CM

## 2015-11-15 DIAGNOSIS — E7800 Pure hypercholesterolemia, unspecified: Secondary | ICD-10-CM

## 2015-11-15 DIAGNOSIS — Z0000 Encounter for general adult medical examination without abnormal findings: Secondary | ICD-10-CM

## 2015-11-15 NOTE — Patient Instructions (Signed)

## 2015-11-15 NOTE — Progress Notes (Signed)
Patient: Madison Mason, Female    DOB: [birth year], 60 y.o.   MRN: [id number] Visit Date: 11/15/2015  Today's Provider: Margaretann Loveless, PA-C   Chief Complaint  Patient presents with  . Annual Exam   Subjective:    Annual physical exam Madison Mason is a 60 y.o. female who presents today for health maintenance and complete physical. She feels well. She reports exercising. She reports she is sleeping fairly well.  Patient Declined Influenza vaccine.  10/31/14-CPE 08/27/11-Pap-negative and HPV-negative 11/08/2015-Mammogram-BI-RADS 1 -----------------------------------------------------------------   Review of Systems  Constitutional: Negative.   HENT: Negative.   Eyes: Negative.   Respiratory: Negative.   Cardiovascular: Negative.   Gastrointestinal: Negative.   Endocrine: Negative.   Genitourinary: Negative.   Musculoskeletal: Negative.   Skin: Negative.   Allergic/Immunologic: Negative.   Neurological: Negative.   Hematological: Negative.   Psychiatric/Behavioral: Negative.     Social History      She  reports that she quit smoking about 18 years ago. Her smoking use included Cigarettes. She has a 25.00 pack-year smoking history. She has never used smokeless tobacco. She reports that she drinks alcohol. She reports that she does not use drugs.       Social History   Social History  . Marital status: Married    Spouse name: N/A  . Number of children: N/A  . Years of education: N/A   Social History Main Topics  . Smoking status: Former Smoker    Packs/day: 1.00    Years: 25.00    Types: Cigarettes    Quit date: 01/05/1997  . Smokeless tobacco: Never Used     Comment: Quit in 1988  . Alcohol use Yes     Comment: occasionally  . Drug use: No  . Sexual activity: Yes   Other Topics Concern  . None   Social History Narrative  . None    Past Medical History:  Diagnosis Date  . GERD (gastroesophageal reflux disease)   . Hyperlipidemia        Patient Active Problem List   Diagnosis Date Noted  . Blood glucose elevated 10/31/2014  . Cannot sleep 10/31/2014  . Adult BMI 30+ 10/31/2014  . Avitaminosis D 10/31/2014  . Diabetes mellitus without complication (HCC) 10/31/2014  . Current tobacco use 07/26/2008  . Dermatologic disease 07/14/2007  . Hypercholesteremia 05/22/2006  . Allergic rhinitis 03/24/2006    Past Surgical History:  Procedure Laterality Date  . CESAREAN SECTION  1989 and 1986    Family History        Family Status  Relation Status  . Father Alive  . Mother Alive  . Sister Alive  . Sister Alive  . Sister Alive  . Maternal Aunt   . Maternal Aunt         Her family history includes Breast cancer in her maternal aunt and maternal aunt; Cancer in her father; Diabetes in her mother; Healthy in her sister, sister, and sister; Heart disease in her father.     Allergies  Allergen Reactions  . Sulfa Antibiotics Rash     Current Outpatient Prescriptions:  .  cholecalciferol (VITAMIN D) 1000 UNITS tablet, Take 1,000 Units by mouth daily., Disp: , Rfl:  .  famotidine (PEPCID) 10 MG tablet, Take 10 mg by mouth daily., Disp: , Rfl:  .  loratadine-pseudoephedrine (CLARITIN-D 12 HOUR) 5-120 MG tablet, CLARITIN-D 12 HOUR, 5-120MG  (Oral Tablet Extended Release 12 Hour)  1 Every Day for 0 days  Quantity: 0.00;  Refills: 0   Ordered :21-Aug-2010  Kavin LeechWalsh, Laura ;  Started 13-Aug-2009 Active, Disp: , Rfl:  .  metFORMIN (GLUCOPHAGE) 1000 MG tablet, Take 1 tablet (1,000 mg total) by mouth 2 (two) times daily with a meal., Disp: 180 tablet, Rfl: 3 .  Misc Natural Products (OSTEO BI-FLEX ADV TRIPLE ST) TABS, Take 1 tablet by mouth daily., Disp: , Rfl:  .  MULTIPLE VITAMINS-MINERALS PO, Take 1 tablet by mouth daily. , Disp: , Rfl:  .  simvastatin (ZOCOR) 20 MG tablet, Take 1 tablet (20 mg total) by mouth at bedtime., Disp: 90 tablet, Rfl: 3   Patient Care Team: Margaretann LovelessJennifer M Marquisa Salih, PA-C as PCP - General (Family  Medicine)      Objective:   Vitals: BP 110/70 (BP Location: Right Arm, Patient Position: Sitting, Cuff Size: Large)   Pulse 83   Temp 97.5 F (36.4 C) (Oral)   Resp 16   Wt 215 lb 12.8 oz (97.9 kg)   BMI 34.83 kg/m    Physical Exam  Constitutional: She is oriented to person, place, and time. She appears well-developed and well-nourished. No distress.  HENT:  Head: Normocephalic and atraumatic.  Right Ear: Hearing, tympanic membrane, external ear and ear canal normal.  Left Ear: Hearing, tympanic membrane, external ear and ear canal normal.  Nose: Nose normal.  Mouth/Throat: Uvula is midline, oropharynx is clear and moist and mucous membranes are normal. No oropharyngeal exudate.  Eyes: Conjunctivae and EOM are normal. Pupils are equal, round, and reactive to light. Right eye exhibits no discharge. Left eye exhibits no discharge. No scleral icterus.  Neck: Normal range of motion. Neck supple. No JVD present. Carotid bruit is not present. No tracheal deviation present. No thyromegaly present.  Cardiovascular: Normal rate, regular rhythm, normal heart sounds and intact distal pulses.  Exam reveals no gallop and no friction rub.   No murmur heard. Pulmonary/Chest: Effort normal and breath sounds normal. No respiratory distress. She has no wheezes. She has no rales. She exhibits no tenderness. Right breast exhibits no inverted nipple, no mass, no nipple discharge, no skin change and no tenderness. Left breast exhibits no inverted nipple, no mass, no nipple discharge, no skin change and no tenderness. Breasts are symmetrical.  Abdominal: Soft. Bowel sounds are normal. She exhibits no distension and no mass. There is no tenderness. There is no rebound and no guarding. Hernia confirmed negative in the right inguinal area and confirmed negative in the left inguinal area.  Genitourinary: Rectum normal, vagina normal and uterus normal. No breast swelling, tenderness, discharge or bleeding. Pelvic  exam was performed with patient supine. There is no rash, tenderness, lesion or injury on the right labia. There is no rash, tenderness, lesion or injury on the left labia. Cervix exhibits no motion tenderness, no discharge and no friability. Right adnexum displays no mass, no tenderness and no fullness. Left adnexum displays no mass, no tenderness and no fullness. No erythema, tenderness or bleeding in the vagina. No signs of injury around the vagina. No vaginal discharge found.  Musculoskeletal: Normal range of motion. She exhibits no edema or tenderness.  Lymphadenopathy:    She has no cervical adenopathy.       Right: No inguinal adenopathy present.       Left: No inguinal adenopathy present.  Neurological: She is alert and oriented to person, place, and time. She has normal reflexes. No cranial nerve deficit. Coordination normal.  Skin: Skin is warm and dry. No rash noted.  She is not diaphoretic.  Psychiatric: She has a normal mood and affect. Her behavior is normal. Judgment and thought content normal.  Vitals reviewed.    Depression Screen PHQ 2/9 Scores 10/31/2014  PHQ - 2 Score 0      Assessment & Plan:     Routine Health Maintenance and Physical Exam  Exercise Activities and Dietary recommendations Goals    . Exercise 150 minutes per week (moderate activity)       Immunization History  Administered Date(s) Administered  . Tdap 07/14/2007    Health Maintenance  Topic Date Due  . Hepatitis C Screening  06-05-1955  . PNEUMOCOCCAL POLYSACCHARIDE VACCINE (1) 12/10/1959  . HIV Screening  12/09/1972  . COLONOSCOPY  12/10/2007  . PAP SMEAR  08/27/2014  . FOOT EXAM  10/31/2015  . OPHTHALMOLOGY EXAM  12/29/2015  . URINE MICROALBUMIN  01/26/2016  . HEMOGLOBIN A1C  02/27/2016  . TETANUS/TDAP  07/13/2017  . MAMMOGRAM  11/07/2017     Discussed health benefits of physical activity, and encouraged her to engage in regular exercise appropriate for her age and condition.      1. Annual physical exam Normal physical exam today. Will check labs as below and f/u pending lab results. If labs are stable and WNL she will not need to have these rechecked for one year at her next annual physical exam. She is to call the office in the meantime if she has any acute issue, questions or concerns. - CBC with Differential/Platelet - Comprehensive metabolic panel - TSH  2. Breast cancer screening Breast exam today was normal. There is no family history of breast cancer. She does perform regular self breast exams. Mammogram done on 11/08/15 and was normal.  3. Cervical cancer screening Pap collected today. Will send as below and f/u pending results. - Pap IG and HPV (high risk) DNA detection  4. Hypercholesteremia Stable. Continue current medical treatment plan with simvastatin 20mg . Will check labs as below and f/u pending results. - Comprehensive metabolic panel - Lipid panel  5. Diabetes mellitus without complication (HCC) Stable. Continue current medical treatment plan with metformin 1000mg  BID and lifestyle modification. Will check labs as below and f/u pending results. - Comprehensive metabolic panel - Hemoglobin A1c  6. Need for hepatitis C screening test - Hepatitis C antibody  --------------------------------------------------------------------    Margaretann LovelessJennifer M Leniyah Martell, PA-C  [facility]

## 2015-11-16 ENCOUNTER — Telehealth: Payer: Self-pay

## 2015-11-16 LAB — COMPREHENSIVE METABOLIC PANEL
ALK PHOS: 70 IU/L (ref 39–117)
ALT: 27 IU/L (ref 0–32)
AST: 27 IU/L (ref 0–40)
Albumin/Globulin Ratio: 1.3 (ref 1.2–2.2)
Albumin: 4.3 g/dL (ref 3.5–5.5)
BILIRUBIN TOTAL: 0.4 mg/dL (ref 0.0–1.2)
BUN/Creatinine Ratio: 9 (ref 9–23)
BUN: 8 mg/dL (ref 6–24)
CHLORIDE: 96 mmol/L (ref 96–106)
CO2: 25 mmol/L (ref 18–29)
Calcium: 10.2 mg/dL (ref 8.7–10.2)
Creatinine, Ser: 0.88 mg/dL (ref 0.57–1.00)
GFR calc Af Amer: 84 mL/min/{1.73_m2} (ref >59)
GFR calc non Af Amer: 73 mL/min/{1.73_m2} (ref >59)
GLUCOSE: 112 mg/dL — AB (ref 65–99)
Globulin, Total: 3.3 g/dL (ref 1.5–4.5)
Potassium: 4.7 mmol/L (ref 3.5–5.2)
Sodium: 137 mmol/L (ref 134–144)
Total Protein: 7.6 g/dL (ref 6.0–8.5)

## 2015-11-16 LAB — CBC WITH DIFFERENTIAL/PLATELET
BASOS ABS: 0 10*3/uL (ref 0.0–0.2)
Basos: 0 %
EOS (ABSOLUTE): 0.3 10*3/uL (ref 0.0–0.4)
Eos: 3 %
Hematocrit: 39.7 % (ref 34.0–46.6)
Hemoglobin: 13.3 g/dL (ref 11.1–15.9)
IMMATURE GRANS (ABS): 0 10*3/uL (ref 0.0–0.1)
Immature Granulocytes: 0 %
LYMPHS ABS: 3.9 10*3/uL — AB (ref 0.7–3.1)
LYMPHS: 39 %
MCH: 30 pg (ref 26.6–33.0)
MCHC: 33.5 g/dL (ref 31.5–35.7)
MCV: 89 fL (ref 79–97)
Monocytes Absolute: 0.6 10*3/uL (ref 0.1–0.9)
Monocytes: 6 %
NEUTROS ABS: 5.3 10*3/uL (ref 1.4–7.0)
Neutrophils: 52 %
PLATELETS: 376 10*3/uL (ref 150–379)
RBC: 4.44 x10E6/uL (ref 3.77–5.28)
RDW: 12.9 % (ref 12.3–15.4)
WBC: 10.2 10*3/uL (ref 3.4–10.8)

## 2015-11-16 LAB — HEMOGLOBIN A1C
Est. average glucose Bld gHb Est-mCnc: 128 mg/dL
HEMOGLOBIN A1C: 6.1 % — AB (ref 4.8–5.6)

## 2015-11-16 LAB — TSH: TSH: 1.73 u[IU]/mL (ref 0.450–4.500)

## 2015-11-16 LAB — LIPID PANEL
CHOLESTEROL TOTAL: 188 mg/dL (ref 100–199)
Chol/HDL Ratio: 3.6 ratio units (ref 0.0–4.4)
HDL: 52 mg/dL (ref >39)
LDL Calculated: 92 mg/dL (ref 0–99)
TRIGLYCERIDES: 221 mg/dL — AB (ref 0–149)
VLDL Cholesterol Cal: 44 mg/dL — ABNORMAL HIGH (ref 5–40)

## 2015-11-16 LAB — PLEASE NOTE

## 2015-11-16 LAB — HEPATITIS C ANTIBODY

## 2015-11-16 NOTE — Telephone Encounter (Signed)
-----   Message from Margaretann LovelessJennifer M Burnette, [place]PA-C sent at 11/16/2015 10:02 AM EST ----- Cholesterol and A1c both improved from last year. All other labs stable. Continue lifestyle modifications.

## 2015-11-16 NOTE — Telephone Encounter (Signed)
Patient advised as below. Patient verbalizes understanding and is in agreement with treatment plan.  

## 2015-11-20 LAB — PAP IG AND HPV HIGH-RISK
HPV, high-risk: NEGATIVE
PAP SMEAR COMMENT: 0

## 2015-11-21 ENCOUNTER — Telehealth: Payer: Self-pay

## 2015-11-21 NOTE — Telephone Encounter (Signed)
-----   Message from Margaretann LovelessJennifer M Burnette, PA-C sent at 11/21/2015  9:23 AM EST ----- Pap is normal, HPV negative.  Will repeat in 3-5 years.

## 2015-11-21 NOTE — Telephone Encounter (Signed)
Left detailed message on voice mail as stated on DPR.

## 2015-11-21 NOTE — Telephone Encounter (Signed)
[id number] 

## 2016-04-19 ENCOUNTER — Encounter: Payer: Self-pay | Admitting: Physician Assistant

## 2016-04-19 DIAGNOSIS — E7800 Pure hypercholesterolemia, unspecified: Secondary | ICD-10-CM

## 2016-04-19 DIAGNOSIS — E119 Type 2 diabetes mellitus without complications: Secondary | ICD-10-CM

## 2016-04-21 MED ORDER — METFORMIN HCL 1000 MG PO TABS: 1000.0000 mg | ORAL_TABLET | Freq: Two times a day (BID) | ORAL | 3 refills | Status: DC

## 2016-04-21 MED ORDER — SIMVASTATIN 20 MG PO TABS: 20.0000 mg | ORAL_TABLET | Freq: Every day | ORAL | 3 refills | Status: DC

## 2016-05-15 ENCOUNTER — Ambulatory Visit: Payer: BC Managed Care – PPO | Admitting: Physician Assistant

## 2016-05-19 ENCOUNTER — Ambulatory Visit (INDEPENDENT_AMBULATORY_CARE_PROVIDER_SITE_OTHER): Payer: BC Managed Care – PPO | Admitting: Physician Assistant

## 2016-05-19 ENCOUNTER — Encounter: Payer: Self-pay | Admitting: Physician Assistant

## 2016-05-19 VITALS — BP 120/80 | HR 60 | Temp 97.9°F | Resp 16 | Wt 219.0 lb

## 2016-05-19 DIAGNOSIS — Z6835 Body mass index (BMI) 35.0-35.9, adult: Secondary | ICD-10-CM | POA: Diagnosis not present

## 2016-05-19 DIAGNOSIS — E119 Type 2 diabetes mellitus without complications: Secondary | ICD-10-CM

## 2016-05-19 DIAGNOSIS — Z720 Tobacco use: Secondary | ICD-10-CM

## 2016-05-19 LAB — POCT UA - MICROALBUMIN: Microalbumin Ur, POC: NEGATIVE mg/L

## 2016-05-19 NOTE — Progress Notes (Addendum)
Patient: Madison Mason Female    DOB: Dec 01, 1955   61 y.o.   MRN: [id number] Visit Date: 05/19/2016  Today's Provider: Margaretann Loveless, PA-C   Chief Complaint  Patient presents with  . Follow-up    Diabetes Mellitus   Subjective:    HPI   Diabetes Mellitus Type II, Follow-up:   Lab Results  Component Value Date   HGBA1C 6.1 (H) 11/15/2015   HGBA1C 6.4 (H) 08/27/2015   HGBA1C 6.4 04/27/2015    Last seen for diabetes 6 months ago.  Management since then includes labs improved from last year. Continue healthy lifestyle and Metformin 1000 MG. She reports excellent compliance with treatment. She is not having side effects. Current symptoms include none and have been stable. Home blood sugar records: not checking  Episodes of hypoglycemia? no   Most Recent Eye Exam: 03/25/2016 Weight trend: stable Current diet: in general, a "healthy" diet   Current exercise: walking  Pertinent Labs:    Component Value Date/Time   CHOL 188 11/15/2015 0000   TRIG 221 (H) 11/15/2015 0000   HDL 52 11/15/2015 0000   LDLCALC 92 11/15/2015 0000   CREATININE 0.88 11/15/2015 0000    Wt Readings from Last 3 Encounters:  05/19/16 219 lb (99.3 kg)  11/15/15 215 lb 12.8 oz (97.9 kg)  08/27/15 221 lb 12.8 oz (100.6 kg)    ------------------------------------------------------------------------      Allergies  Allergen Reactions  . Sulfa Antibiotics Rash     Current Outpatient Prescriptions:  .  cholecalciferol (VITAMIN D) 1000 UNITS tablet, Take 1,000 Units by mouth daily., Disp: , Rfl:  .  famotidine (PEPCID) 10 MG tablet, Take 10 mg by mouth daily., Disp: , Rfl:  .  loratadine-pseudoephedrine (CLARITIN-D 12 HOUR) 5-120 MG tablet, CLARITIN-D 12 HOUR, 5-120MG  (Oral Tablet Extended Release 12 Hour)  1 Every Day for 0 days  Quantity: 0.00;  Refills: 0   Ordered :21-Aug-2010  Kavin Leech ;  Started 13-Aug-2009 Active, Disp: , Rfl:  .  metFORMIN (GLUCOPHAGE) 1000 MG tablet,  Take 1 tablet (1,000 mg total) by mouth 2 (two) times daily with a meal., Disp: 180 tablet, Rfl: 3 .  Misc Natural Products (OSTEO BI-FLEX ADV TRIPLE ST) TABS, Take 1 tablet by mouth daily., Disp: , Rfl:  .  MULTIPLE VITAMINS-MINERALS PO, Take 1 tablet by mouth daily. , Disp: , Rfl:  .  simvastatin (ZOCOR) 20 MG tablet, Take 1 tablet (20 mg total) by mouth at bedtime., Disp: 90 tablet, Rfl: 3  Review of Systems  Constitutional: Negative for fatigue and fever.  HENT: Negative.   Respiratory: Negative.   Cardiovascular: Negative.   Gastrointestinal: Negative.   Endocrine: Negative for polydipsia and polyuria.  Neurological: Negative.     Social History  Substance Use Topics  . Smoking status: Former Smoker    Packs/day: 1.00    Years: 25.00    Types: Cigarettes    Quit date: 01/05/1997  . Smokeless tobacco: Never Used     Comment: Quit in 1988  . Alcohol use Yes     Comment: occasionally   Objective:   BP 120/80 (BP Location: Right Arm, Patient Position: Sitting, Cuff Size: Normal)   Pulse 60   Temp 97.9 F (36.6 C) (Oral)   Resp 16   Wt 219 lb (99.3 kg)   BMI 35.35 kg/m    Physical Exam  Constitutional: She appears well-developed and well-nourished. No distress.  Neck: Normal range of motion. Neck  supple. No tracheal deviation present. No thyromegaly present.  Cardiovascular: Normal rate, regular rhythm and normal heart sounds.  Exam reveals no gallop and no friction rub.   No murmur heard. Pulmonary/Chest: Effort normal and breath sounds normal. No respiratory distress. She has no wheezes. She has no rales.  Lymphadenopathy:    She has no cervical adenopathy.  Skin: She is not diaphoretic.  Vitals reviewed.  Diabetic Foot Exam - Simple   Simple Foot Form Diabetic Foot exam was performed with the following findings:  Yes 05/19/2016  9:31 AM  Visual Inspection No deformities, no ulcerations, no other skin breakdown bilaterally:  Yes Sensation Testing Intact to  touch and monofilament testing bilaterally:  Yes Pulse Check Posterior Tibialis and Dorsalis pulse intact bilaterally:  Yes Comments    Depression screen PHQ 2/9 05/19/2016  Decreased Interest 0  Down, Depressed, Hopeless 0  PHQ - 2 Score 0  Altered sleeping 2  Tired, decreased energy 0  Change in appetite 0  Feeling bad or failure about yourself  0  Trouble concentrating 0  Moving slowly or fidgety/restless 0  Suicidal thoughts 0  PHQ-9 Score 2      Assessment & Plan:     1. Diabetes mellitus without complication (HCC) Urine microalbumin negative. A1c read hemoglobin too low to read thus I will check CBC and A1c in lab. If HgB low will reflex to check iron and get stool cards. Will await results and f/u pending results. 6 month follow up set for CPE and will see sooner if needed per lab results.  - POCT UA - Microalbumin - CBC w/Diff/Platelet - HgB A1c  2. BMI 35.0-35.9,adult Counseled patient on healthy lifestyle modifications including dieting and exercise.   3. Current tobacco use Does not wish to quit smoking at this time.        Margaretann LovelessJennifer M Reynaldo Rossman, PA-C  [facility]

## 2016-05-19 NOTE — Patient Instructions (Signed)
Diabetes Mellitus and Exercise Exercising regularly is important for your overall health, especially when you have diabetes (diabetes mellitus). Exercising is not only about losing weight. It has many health benefits, such as increasing muscle strength and bone density and reducing body fat and stress. This leads to improved fitness, flexibility, and endurance, all of which result in better overall health. Exercise has additional benefits for people with diabetes, including:  Reducing appetite.  Helping to lower and control blood glucose.  Lowering blood pressure.  Helping to control amounts of fatty substances (lipids) in the blood, such as cholesterol and triglycerides.  Helping the body to respond better to insulin (improving insulin sensitivity).  Reducing how much insulin the body needs.  Decreasing the risk for heart disease by:  Lowering cholesterol and triglyceride levels.  Increasing the levels of good cholesterol.  Lowering blood glucose levels. What is my activity plan? Your health care provider or certified diabetes educator can help you make a plan for the type and frequency of exercise (activity plan) that works for you. Make sure that you:  Do at least 150 minutes of moderate-intensity or vigorous-intensity exercise each week. This could be brisk walking, biking, or water aerobics.  Do stretching and strength exercises, such as yoga or weightlifting, at least 2 times a week.  Spread out your activity over at least 3 days of the week.  Get some form of physical activity every day.  Do not go more than 2 days in a row without some kind of physical activity.  Avoid being inactive for more than 90 minutes at a time. Take frequent breaks to walk or stretch.  Choose a type of exercise or activity that you enjoy, and set realistic goals.  Start slowly, and gradually increase the intensity of your exercise over time. What do I need to know about managing my  diabetes?  Check your blood glucose before and after exercising.  If your blood glucose is higher than 240 mg/dL (13.3 mmol/L) before you exercise, check your urine for ketones. If you have ketones in your urine, do not exercise until your blood glucose returns to normal.  Know the symptoms of low blood glucose (hypoglycemia) and how to treat it. Your risk for hypoglycemia increases during and after exercise. Common symptoms of hypoglycemia can include:  Hunger.  Anxiety.  Sweating and feeling clammy.  Confusion.  Dizziness or feeling light-headed.  Increased heart rate or palpitations.  Blurry vision.  Tingling or numbness around the mouth, lips, or tongue.  Tremors or shakes.  Irritability.  Keep a rapid-acting carbohydrate snack available before, during, and after exercise to help prevent or treat hypoglycemia.  Avoid injecting insulin into areas of the body that are going to be exercised. For example, avoid injecting insulin into:  The arms, when playing tennis.  The legs, when jogging.  Keep records of your exercise habits. Doing this can help you and your health care provider adjust your diabetes management plan as needed. Write down:  Food that you eat before and after you exercise.  Blood glucose levels before and after you exercise.  The type and amount of exercise you have done.  When your insulin is expected to peak, if you use insulin. Avoid exercising at times when your insulin is peaking.  When you start a new exercise or activity, work with your health care provider to make sure the activity is safe for you, and to adjust your insulin, medicines, or food intake as needed.  Drink plenty   of water while you exercise to prevent dehydration or heat stroke. Drink enough fluid to keep your urine clear or pale yellow. This information is not intended to replace advice given to you by your health care provider. Make sure you discuss any questions you have with  your health care provider. Document Released: 03/15/2003 Document Revised: 07/13/2015 Document Reviewed: 06/04/2015 Elsevier Interactive Patient Education  2017 [employer]  

## 2016-05-20 ENCOUNTER — Telehealth: Payer: Self-pay

## 2016-05-20 LAB — CBC WITH DIFFERENTIAL/PLATELET
Basophils Absolute: 0.1 10*3/uL (ref 0.0–0.2)
Basos: 1 %
EOS (ABSOLUTE): 0.5 10*3/uL — AB (ref 0.0–0.4)
Eos: 5 %
HEMOGLOBIN: 13 g/dL (ref 11.1–15.9)
Hematocrit: 39.4 % (ref 34.0–46.6)
IMMATURE GRANS (ABS): 0 10*3/uL (ref 0.0–0.1)
IMMATURE GRANULOCYTES: 0 %
LYMPHS: 45 %
Lymphocytes Absolute: 4.2 10*3/uL — ABNORMAL HIGH (ref 0.7–3.1)
MCH: 30 pg (ref 26.6–33.0)
MCHC: 33 g/dL (ref 31.5–35.7)
MCV: 91 fL (ref 79–97)
MONOCYTES: 5 %
Monocytes Absolute: 0.4 10*3/uL (ref 0.1–0.9)
NEUTROS PCT: 44 %
Neutrophils Absolute: 4 10*3/uL (ref 1.4–7.0)
PLATELETS: 355 10*3/uL (ref 150–379)
RBC: 4.34 x10E6/uL (ref 3.77–5.28)
RDW: 14 % (ref 12.3–15.4)
WBC: 9.2 10*3/uL (ref 3.4–10.8)

## 2016-05-20 LAB — HEMOGLOBIN A1C
Est. average glucose Bld gHb Est-mCnc: 134 mg/dL
HEMOGLOBIN A1C: 6.3 % — AB (ref 4.8–5.6)

## 2016-05-20 NOTE — Telephone Encounter (Signed)
Per patient she saw her results on MyChart.  Thanks,  -Joseline

## 2016-05-20 NOTE — Telephone Encounter (Signed)
-----   Message from Margaretann LovelessJennifer M Burnette, PA-C sent at 05/20/2016 10:10 AM EDT ----- HgB is stable at 13.0 so no anemia!!! HgBA1c increased to 6.3 from 6.1 but still under goal. Continue medications (metformin) as prescribed. Continue healthy lifestyle modifications with dieting and exercise.

## 2016-10-16 ENCOUNTER — Other Ambulatory Visit: Payer: Self-pay | Admitting: Physician Assistant

## 2016-10-16 DIAGNOSIS — Z1231 Encounter for screening mammogram for malignant neoplasm of breast: Secondary | ICD-10-CM

## 2016-11-10 ENCOUNTER — Ambulatory Visit
Admission: RE | Admit: 2016-11-10 | Discharge: 2016-11-10 | Disposition: A | Discharge status: Home / Self Care | Payer: BC Managed Care – PPO | Source: Ambulatory Visit | Attending: Physician Assistant | Admitting: Physician Assistant

## 2016-11-10 DIAGNOSIS — Z1231 Encounter for screening mammogram for malignant neoplasm of breast: Secondary | ICD-10-CM | POA: Diagnosis not present

## 2016-11-19 ENCOUNTER — Other Ambulatory Visit: Payer: Self-pay

## 2016-11-19 ENCOUNTER — Encounter: Payer: Self-pay | Admitting: Physician Assistant

## 2016-11-19 ENCOUNTER — Ambulatory Visit (INDEPENDENT_AMBULATORY_CARE_PROVIDER_SITE_OTHER): Payer: BC Managed Care – PPO | Admitting: Physician Assistant

## 2016-11-19 VITALS — BP 112/80 | HR 75 | Resp 16 | Ht 66.0 in | Wt 216.0 lb

## 2016-11-19 DIAGNOSIS — Z0000 Encounter for general adult medical examination without abnormal findings: Secondary | ICD-10-CM

## 2016-11-19 DIAGNOSIS — E119 Type 2 diabetes mellitus without complications: Secondary | ICD-10-CM | POA: Diagnosis not present

## 2016-11-19 DIAGNOSIS — M25551 Pain in right hip: Secondary | ICD-10-CM

## 2016-11-19 DIAGNOSIS — E7800 Pure hypercholesterolemia, unspecified: Secondary | ICD-10-CM

## 2016-11-19 NOTE — Progress Notes (Signed)
Patient: Madison Mason, Female    DOB: 02/04/1955, 61 y.o.   MRN: [id number] Visit Date: 11/19/2016  Today's Provider: Margaretann LovelessJennifer M Tynisa Vohs, PA-C   Chief Complaint  Patient presents with  . Annual Exam   Subjective:    Annual physical exam Madison Mason is a 61 y.o. female who presents today for health maintenance and complete physical. She feels well. She reports exercising. She reports she is not sleeping well.  CPE:11/15/15 Pap:11/25/15-Neg,HPV-neg Mammogram;11/10/16 BI-RADS 1 Patient reports she had colonoscopy 4-5 years ago and was normal. She cannot remember where or who performed it. We have nothing on file. She has no family history of colon cancer. We have referral on file for 09/10/10 to Dr. Tyna JakschIfthikar by Dr. Elease HashimotoMaloney. [facility] but [facility] have no colonoscopy on file for patient.   She does have complaint of right hip pain. She has seen a chiropractor for 3 weeks with only minimal relief. She takes IBU prn. She feels that this may be causing her to not sleep well.  -----------------------------------------------------------------   Review of Systems  Constitutional: Negative.   HENT: Negative.   Eyes: Negative.   Respiratory: Negative.   Cardiovascular: Negative.   Gastrointestinal: Negative.   Endocrine: Negative.   Genitourinary: Negative.   Musculoskeletal: Negative.   Skin: Negative.   Allergic/Immunologic: Negative.   Neurological: Negative.   Hematological: Negative.   Psychiatric/Behavioral: Negative.     Social History      She  reports that she quit smoking about 19 years ago. Her smoking use included cigarettes. She has a 25.00 pack-year smoking history. she has never used smokeless tobacco. She reports that she drinks alcohol. She reports that she does not use drugs.       Social History   Socioeconomic History  . Marital status: Married    Spouse name: None  . Number of children: None  . Years of education: None  .  Highest education level: None  Social Needs  . Financial resource strain: None  . Food insecurity - worry: None  . Food insecurity - inability: None  . Transportation needs - medical: None  . Transportation needs - non-medical: None  Occupational History  . None  Tobacco Use  . Smoking status: Former Smoker    Packs/day: 1.00    Years: 25.00    Pack years: 25.00    Types: Cigarettes    Last attempt to quit: 01/05/1997    Years since quitting: 19.8  . Smokeless tobacco: Never Used  . Tobacco comment: Quit in 1988  Substance and Sexual Activity  . Alcohol use: Yes    Comment: occasionally  . Drug use: No  . Sexual activity: Yes  Other Topics Concern  . None  Social History Narrative  . None    Past Medical History:  Diagnosis Date  . GERD (gastroesophageal reflux disease)   . Hyperlipidemia      Patient Active Problem List   Diagnosis Date Noted  . Cannot sleep 10/31/2014  . Adult BMI 30+ 10/31/2014  . Avitaminosis D 10/31/2014  . Diabetes mellitus without complication (HCC) 10/31/2014  . Current tobacco use 07/26/2008  . Dermatologic disease 07/14/2007  . Hypercholesteremia 05/22/2006  . Allergic rhinitis 03/24/2006    Past Surgical History:  Procedure Laterality Date  . CESAREAN SECTION  1989 and 1986    Family History        Family Status  Relation Name Status  . Father  Alive  . Mother  Alive  . Sister  Alive  . Sister  Alive  . Sister  Alive  . Mat Aunt  (Not Specified)  . Mat Aunt  (Not Specified)        Her family history includes Breast cancer in her maternal aunt and maternal aunt; Cancer in her father; Diabetes in her mother; Healthy in her sister, sister, and sister; Heart disease in her father.     Allergies  Allergen Reactions  . Sulfa Antibiotics Rash     Current Outpatient Medications:  .  cholecalciferol (VITAMIN D) 1000 UNITS tablet, Take 1,000 Units by mouth daily., Disp: , Rfl:  .  famotidine (PEPCID) 10 MG tablet, Take 10  mg by mouth daily., Disp: , Rfl:  .  loratadine-pseudoephedrine (CLARITIN-D 12 HOUR) 5-120 MG tablet, CLARITIN-D 12 HOUR, 5-120MG  (Oral Tablet Extended Release 12 Hour)  1 Every Day for 0 days  Quantity: 0.00;  Refills: 0   Ordered :21-Aug-2010  Kavin Leech ;  Started 13-Aug-2009 Active, Disp: , Rfl:  .  metFORMIN (GLUCOPHAGE) 1000 MG tablet, Take 1 tablet (1,000 mg total) by mouth 2 (two) times daily with a meal., Disp: 180 tablet, Rfl: 3 .  Misc Natural Products (OSTEO BI-FLEX ADV TRIPLE ST) TABS, Take 1 tablet by mouth daily., Disp: , Rfl:  .  MULTIPLE VITAMINS-MINERALS PO, Take 1 tablet by mouth daily. , Disp: , Rfl:  .  simvastatin (ZOCOR) 20 MG tablet, Take 1 tablet (20 mg total) by mouth at bedtime., Disp: 90 tablet, Rfl: 3   Patient Care Team: Margaretann Loveless, PA-C as PCP - General (Family Medicine)      Objective:   Vitals: BP 112/80 (BP Location: Left Arm, Patient Position: Sitting, Cuff Size: Large)   Pulse 75   Resp 16   Ht 5\' 6"  (1.676 m)   Wt 216 lb (98 kg)   BMI 34.86 kg/m    Physical Exam  Constitutional: She is oriented to person, place, and time. She appears well-developed and well-nourished. No distress.  HENT:  Head: Normocephalic and atraumatic.  Right Ear: Hearing, tympanic membrane, external ear and ear canal normal.  Left Ear: Hearing, tympanic membrane, external ear and ear canal normal.  Nose: Nose normal.  Mouth/Throat: Uvula is midline, oropharynx is clear and moist and mucous membranes are normal. No oropharyngeal exudate.  Eyes: Conjunctivae and EOM are normal. Pupils are equal, round, and reactive to light. Right eye exhibits no discharge. Left eye exhibits no discharge. No scleral icterus.  Neck: Normal range of motion. Neck supple. No JVD present. Carotid bruit is not present. No tracheal deviation present. No thyromegaly present.  Cardiovascular: Normal rate, regular rhythm, normal heart sounds and intact distal pulses. Exam reveals no gallop and  no friction rub.  No murmur heard. Pulmonary/Chest: Effort normal and breath sounds normal. No respiratory distress. She has no wheezes. She has no rales. She exhibits no tenderness. Right breast exhibits no inverted nipple, no mass, no nipple discharge, no skin change and no tenderness. Left breast exhibits no inverted nipple, no mass, no nipple discharge, no skin change and no tenderness. Breasts are symmetrical.  Abdominal: Soft. Bowel sounds are normal. She exhibits no distension and no mass. There is no tenderness. There is no rebound and no guarding.  Musculoskeletal: Normal range of motion. She exhibits no edema or tenderness.  Lymphadenopathy:    She has no cervical adenopathy.  Neurological: She is alert and oriented to person, place, and time. She  has normal reflexes.  Skin: Skin is warm and dry. No rash noted. She is not diaphoretic.  Psychiatric: She has a normal mood and affect. Her behavior is normal. Judgment and thought content normal.  Vitals reviewed.    Depression Screen PHQ 2/9 Scores 11/19/2016 05/19/2016 10/31/2014  PHQ - 2 Score 0 0 0  PHQ- 9 Score - 2 -      Assessment & Plan:     Routine Health Maintenance and Physical Exam  Exercise Activities and Dietary recommendations Goals    . Exercise 150 minutes per week (moderate activity)       Immunization History  Administered Date(s) Administered  . Tdap 07/14/2007    Health Maintenance  Topic Date Due  . PNEUMOCOCCAL POLYSACCHARIDE VACCINE (1) 12/10/1959  . COLONOSCOPY  12/10/2007  . OPHTHALMOLOGY EXAM  12/29/2015  . HEMOGLOBIN A1C  11/19/2016  . FOOT EXAM  05/19/2017  . URINE MICROALBUMIN  05/19/2017  . TETANUS/TDAP  07/13/2017  . MAMMOGRAM  11/11/2018  . PAP SMEAR  11/15/2018  . Hepatitis C Screening  Completed  . HIV Screening  Discontinued     Discussed health benefits of physical activity, and encouraged her to engage in regular exercise appropriate for her age and condition.    1.  Annual physical exam Normal physical exam today. Will check labs as below and f/u pending lab results. If labs are stable and WNL she will not need to have these rechecked for one year at her next annual physical exam. She is to call the office in the meantime if she has any acute issue, questions or concerns. - CBC with Differential/Platelet - Comprehensive metabolic panel - TSH  2. Diabetes mellitus without complication (HCC) Stable on metformin 1000mg  BID. Will check labs as below and f/u pending results. - Hemoglobin A1c  3. Hypercholesteremia Stable on Simvastatin 20mg . Will check labs as below and f/u pending results. - Lipid panel  4. Right hip pain Will try tumeric. Call if symptoms worsen.    Margaretann LovelessJennifer M Jaycelyn Orrison, PA-C  [facility]

## 2016-11-19 NOTE — Patient Instructions (Signed)
Health Maintenance for Postmenopausal Women Menopause is a normal process in which your reproductive ability comes to an end. This process happens gradually over a span of months to years, usually between the ages of 22 and 9. Menopause is complete when you have missed 12 consecutive menstrual periods. It is important to talk with your health care provider about some of the most common conditions that affect postmenopausal women, such as heart disease, cancer, and bone loss (osteoporosis). Adopting a healthy lifestyle and getting preventive care can help to promote your health and wellness. Those actions can also lower your chances of developing some of these common conditions. What should I know about menopause? During menopause, you may experience a number of symptoms, such as:  Moderate-to-severe hot flashes.  Night sweats.  Decrease in sex drive.  Mood swings.  Headaches.  Tiredness.  Irritability.  Memory problems.  Insomnia.  Choosing to treat or not to treat menopausal changes is an individual decision that you make with your health care provider. What should I know about hormone replacement therapy and supplements? Hormone therapy products are effective for treating symptoms that are associated with menopause, such as hot flashes and night sweats. Hormone replacement carries certain risks, especially as you become older. If you are thinking about using estrogen or estrogen with progestin treatments, discuss the benefits and risks with your health care provider. What should I know about heart disease and stroke? Heart disease, heart attack, and stroke become more likely as you age. This may be due, in part, to the hormonal changes that your body experiences during menopause. These can affect how your body processes dietary fats, triglycerides, and cholesterol. Heart attack and stroke are both medical emergencies. There are many things that you can do to help prevent heart disease  and stroke:  Have your blood pressure checked at least every 1-2 years. High blood pressure causes heart disease and increases the risk of stroke.  If you are 53-22 years old, ask your health care provider if you should take aspirin to prevent a heart attack or a stroke.  Do not use any tobacco products, including cigarettes, chewing tobacco, or electronic cigarettes. If you need help quitting, ask your health care provider.  It is important to eat a healthy diet and maintain a healthy weight. ? Be sure to include plenty of vegetables, fruits, low-fat dairy products, and lean protein. ? Avoid eating foods that are high in solid fats, added sugars, or salt (sodium).  Get regular exercise. This is one of the most important things that you can do for your health. ? Try to exercise for at least 150 minutes each week. The type of exercise that you do should increase your heart rate and make you sweat. This is known as moderate-intensity exercise. ? Try to do strengthening exercises at least twice each week. Do these in addition to the moderate-intensity exercise.  Know your numbers.Ask your health care provider to check your cholesterol and your blood glucose. Continue to have your blood tested as directed by your health care provider.  What should I know about cancer screening? There are several types of cancer. Take the following steps to reduce your risk and to catch any cancer development as early as possible. Breast Cancer  Practice breast self-awareness. ? This means understanding how your breasts normally appear and feel. ? It also means doing regular breast self-exams. Let your health care provider know about any changes, no matter how small.  If you are 40  or older, have a clinician do a breast exam (clinical breast exam or CBE) every year. Depending on your age, family history, and medical history, it may be recommended that you also have a yearly breast X-ray (mammogram).  If you  have a family history of breast cancer, talk with your health care provider about genetic screening.  If you are at high risk for breast cancer, talk with your health care provider about having an MRI and a mammogram every year.  Breast cancer (BRCA) gene test is recommended for women who have family members with BRCA-related cancers. Results of the assessment will determine the need for genetic counseling and BRCA1 and for BRCA2 testing. BRCA-related cancers include these types: ? Breast. This occurs in males or females. ? Ovarian. ? Tubal. This may also be called fallopian tube cancer. ? Cancer of the abdominal or pelvic lining (peritoneal cancer). ? Prostate. ? Pancreatic.  Cervical, Uterine, and Ovarian Cancer Your health care provider may recommend that you be screened regularly for cancer of the pelvic organs. These include your ovaries, uterus, and vagina. This screening involves a pelvic exam, which includes checking for microscopic changes to the surface of your cervix (Pap test).  For women ages 21-65, health care providers may recommend a pelvic exam and a Pap test every three years. For women ages 79-65, they may recommend the Pap test and pelvic exam, combined with testing for human papilloma virus (HPV), every five years. Some types of HPV increase your risk of cervical cancer. Testing for HPV may also be done on women of any age who have unclear Pap test results.  Other health care providers may not recommend any screening for nonpregnant women who are considered low risk for pelvic cancer and have no symptoms. Ask your health care provider if a screening pelvic exam is right for you.  If you have had past treatment for cervical cancer or a condition that could lead to cancer, you need Pap tests and screening for cancer for at least 20 years after your treatment. If Pap tests have been discontinued for you, your risk factors (such as having a new sexual partner) need to be  reassessed to determine if you should start having screenings again. Some women have medical problems that increase the chance of getting cervical cancer. In these cases, your health care provider may recommend that you have screening and Pap tests more often.  If you have a family history of uterine cancer or ovarian cancer, talk with your health care provider about genetic screening.  If you have vaginal bleeding after reaching menopause, tell your health care provider.  There are currently no reliable tests available to screen for ovarian cancer.  Lung Cancer Lung cancer screening is recommended for adults 69-62 years old who are at high risk for lung cancer because of a history of smoking. A yearly low-dose CT scan of the lungs is recommended if you:  Currently smoke.  Have a history of at least 30 pack-years of smoking and you currently smoke or have quit within the past 15 years. A pack-year is smoking an average of one pack of cigarettes per day for one year.  Yearly screening should:  Continue until it has been 15 years since you quit.  Stop if you develop a health problem that would prevent you from having lung cancer treatment.  Colorectal Cancer  This type of cancer can be detected and can often be prevented.  Routine colorectal cancer screening usually begins at  age 42 and continues through age 45.  If you have risk factors for colon cancer, your health care provider may recommend that you be screened at an earlier age.  If you have a family history of colorectal cancer, talk with your health care provider about genetic screening.  Your health care provider may also recommend using home test kits to check for hidden blood in your stool.  A small camera at the end of a tube can be used to examine your colon directly (sigmoidoscopy or colonoscopy). This is done to check for the earliest forms of colorectal cancer.  Direct examination of the colon should be repeated every  5-10 years until age 71. However, if early forms of precancerous polyps or small growths are found or if you have a family history or genetic risk for colorectal cancer, you may need to be screened more often.  Skin Cancer  Check your skin from head to toe regularly.  Monitor any moles. Be sure to tell your health care provider: ? About any new moles or changes in moles, especially if there is a change in a mole's shape or color. ? If you have a mole that is larger than the size of a pencil eraser.  If any of your family members has a history of skin cancer, especially at a young age, talk with your health care provider about genetic screening.  Always use sunscreen. Apply sunscreen liberally and repeatedly throughout the day.  Whenever you are outside, protect yourself by wearing long sleeves, pants, a wide-brimmed hat, and sunglasses.  What should I know about osteoporosis? Osteoporosis is a condition in which bone destruction happens more quickly than new bone creation. After menopause, you may be at an increased risk for osteoporosis. To help prevent osteoporosis or the bone fractures that can happen because of osteoporosis, the following is recommended:  If you are 46-71 years old, get at least 1,000 mg of calcium and at least 600 mg of vitamin D per day.  If you are older than age 55 but younger than age 65, get at least 1,200 mg of calcium and at least 600 mg of vitamin D per day.  If you are older than age 54, get at least 1,200 mg of calcium and at least 800 mg of vitamin D per day.  Smoking and excessive alcohol intake increase the risk of osteoporosis. Eat foods that are rich in calcium and vitamin D, and do weight-bearing exercises several times each week as directed by your health care provider. What should I know about how menopause affects my mental health? Depression may occur at any age, but it is more common as you become older. Common symptoms of depression  include:  Low or sad mood.  Changes in sleep patterns.  Changes in appetite or eating patterns.  Feeling an overall lack of motivation or enjoyment of activities that you previously enjoyed.  Frequent crying spells.  Talk with your health care provider if you think that you are experiencing depression. What should I know about immunizations? It is important that you get and maintain your immunizations. These include:  Tetanus, diphtheria, and pertussis (Tdap) booster vaccine.  Influenza every year before the flu season begins.  Pneumonia vaccine.  Shingles vaccine.  Your health care provider may also recommend other immunizations. This information is not intended to replace advice given to you by your health care provider. Make sure you discuss any questions you have with your health care provider. Document Released: 02/14/2005  Document Revised: 07/13/2015 Document Reviewed: 09/26/2014 Elsevier Interactive Patient Education  2018 [employer]  

## 2016-11-20 ENCOUNTER — Telehealth: Payer: Self-pay

## 2016-11-20 LAB — CBC WITH DIFFERENTIAL/PLATELET
Basophils Absolute: 66 cells/uL (ref 0–200)
Basophils Relative: 0.8 %
EOS PCT: 3.4 %
Eosinophils Absolute: 282 cells/uL (ref 15–500)
HEMATOCRIT: 39.3 % (ref 35.0–45.0)
HEMOGLOBIN: 13.1 g/dL (ref 11.7–15.5)
LYMPHS ABS: 3162 {cells}/uL (ref 850–3900)
MCH: 29.3 pg (ref 27.0–33.0)
MCHC: 33.3 g/dL (ref 32.0–36.0)
MCV: 87.9 fL (ref 80.0–100.0)
MONOS PCT: 5.1 %
MPV: 10.1 fL (ref 7.5–12.5)
NEUTROS ABS: 4366 {cells}/uL (ref 1500–7800)
NEUTROS PCT: 52.6 %
Platelets: 351 10*3/uL (ref 140–400)
RBC: 4.47 10*6/uL (ref 3.80–5.10)
RDW: 12.6 % (ref 11.0–15.0)
Total Lymphocyte: 38.1 %
WBC mixed population: 423 cells/uL (ref 200–950)
WBC: 8.3 10*3/uL (ref 3.8–10.8)

## 2016-11-20 LAB — COMPLETE METABOLIC PANEL WITH GFR
AG Ratio: 1.4 (calc) (ref 1.0–2.5)
ALKALINE PHOSPHATASE (APISO): 67 U/L (ref 33–130)
ALT: 35 U/L — AB (ref 6–29)
AST: 42 U/L — AB (ref 10–35)
Albumin: 4.6 g/dL (ref 3.6–5.1)
BUN: 13 mg/dL (ref 7–25)
CALCIUM: 10.3 mg/dL (ref 8.6–10.4)
CO2: 27 mmol/L (ref 20–32)
CREATININE: 0.73 mg/dL (ref 0.50–1.05)
Chloride: 100 mmol/L (ref 98–110)
GFR, EST NON AFRICAN AMERICAN: 91 mL/min/{1.73_m2} (ref >=60)
GFR, Est African American: 105 mL/min/{1.73_m2} (ref >=60)
GLOBULIN: 3.2 g/dL (ref 1.9–3.7)
GLUCOSE: 110 mg/dL — AB (ref 65–99)
Potassium: 4.4 mmol/L (ref 3.5–5.3)
Sodium: 137 mmol/L (ref 135–146)
Total Bilirubin: 0.6 mg/dL (ref 0.2–1.2)
Total Protein: 7.8 g/dL (ref 6.1–8.1)

## 2016-11-20 LAB — HEMOGLOBIN A1C
EAG (MMOL/L): 7.6 (calc)
HEMOGLOBIN A1C: 6.4 %{Hb} — AB (ref <5.7)
Mean Plasma Glucose: 137 (calc)

## 2016-11-20 LAB — LIPID PANEL
Cholesterol: 209 mg/dL — ABNORMAL HIGH (ref <200)
HDL: 56 mg/dL (ref >50)
LDL Cholesterol (Calc): 113 mg/dL (calc) — ABNORMAL HIGH
Non-HDL Cholesterol (Calc): 153 mg/dL (calc) — ABNORMAL HIGH (ref <130)
TRIGLYCERIDES: 277 mg/dL — AB (ref <150)
Total CHOL/HDL Ratio: 3.7 (calc) (ref <5.0)

## 2016-11-20 LAB — TSH: TSH: 1.62 m[IU]/L (ref 0.40–4.50)

## 2016-11-20 NOTE — Telephone Encounter (Signed)
-----   Message from Margaretann LovelessJennifer M Burnette, [place]PA-C sent at 11/20/2016  8:09 AM EST ----- Cholesterol is up compared to last year. Make sure to be taking simvastatin 20mg . A1c up to 6.4 from 6.3. Continue metformin 1000mg  twice daily. Liver enzymes up again compared to last year. Work on healthy lifestyle modifications. Limit fatty foods, red meats, and carbs from diet. Increase physical activity to try to get in 150 min moderate activity per week. All other labs are WNL and stable.

## 2016-11-20 NOTE — Telephone Encounter (Signed)
LMTCB  Thanks,  -Joseline 

## 2016-11-24 NOTE — Telephone Encounter (Signed)
Advised patient of results.  

## 2017-04-13 ENCOUNTER — Telehealth: Payer: Self-pay | Admitting: Physician Assistant

## 2017-04-13 NOTE — Telephone Encounter (Signed)
Faxed ROI tp Dr Larey SeatHtikhar [facility] on 10/23/16 and 10/28/2016

## 2017-04-20 ENCOUNTER — Encounter: Payer: Self-pay | Admitting: Physician Assistant

## 2017-04-20 DIAGNOSIS — E7800 Pure hypercholesterolemia, unspecified: Secondary | ICD-10-CM

## 2017-04-20 DIAGNOSIS — E119 Type 2 diabetes mellitus without complications: Secondary | ICD-10-CM

## 2017-04-20 MED ORDER — METFORMIN HCL 1000 MG PO TABS: 1000.0000 mg | ORAL_TABLET | Freq: Two times a day (BID) | ORAL | 3 refills | Status: DC

## 2017-04-20 MED ORDER — SIMVASTATIN 20 MG PO TABS: 20.0000 mg | ORAL_TABLET | Freq: Every day | ORAL | 3 refills | Status: DC

## 2017-05-19 ENCOUNTER — Other Ambulatory Visit: Payer: Self-pay

## 2017-05-19 ENCOUNTER — Ambulatory Visit: Payer: BC Managed Care – PPO | Admitting: Physician Assistant

## 2017-05-19 ENCOUNTER — Encounter: Payer: Self-pay | Admitting: Physician Assistant

## 2017-05-19 VITALS — BP 124/80 | HR 79 | Temp 98.7°F | Wt 214.0 lb

## 2017-05-19 DIAGNOSIS — R748 Abnormal levels of other serum enzymes: Secondary | ICD-10-CM | POA: Diagnosis not present

## 2017-05-19 DIAGNOSIS — F5101 Primary insomnia: Secondary | ICD-10-CM | POA: Diagnosis not present

## 2017-05-19 DIAGNOSIS — E119 Type 2 diabetes mellitus without complications: Secondary | ICD-10-CM | POA: Diagnosis not present

## 2017-05-19 NOTE — Patient Instructions (Signed)

## 2017-05-19 NOTE — Progress Notes (Signed)
Patient: Madison Mason Female    DOB: [birth year]   62 y.o.   MRN: [id number] Visit Date: 05/19/2017  Today's Provider: Margaretann Loveless, PA-C   Chief Complaint  Patient presents with  . Diabetes    6 month follow up   Subjective:    Diabetes  She presents for her follow-up diabetic visit. She has type 2 diabetes mellitus. Her disease course has been stable. There are no hypoglycemic associated symptoms. Pertinent negatives for diabetes include no blurred vision and no fatigue. There are no hypoglycemic complications. There are no diabetic complications. Current diabetic treatment includes oral agent (monotherapy). Her weight is stable. She is following a diabetic diet. She participates in exercise daily. Home blood sugar record trend: does not monitor at home.   Insomnia: excedrin pm, aleve om, melatonin. Sleep 1-2 hours then wake up. Be up for 1-2 hours then will try to fall back sleep.     Allergies  Allergen Reactions  . Sulfa Antibiotics Rash     Current Outpatient Medications:  .  cholecalciferol (VITAMIN D) 1000 UNITS tablet, Take 1,000 Units by mouth daily., Disp: , Rfl:  .  famotidine (PEPCID) 10 MG tablet, Take 10 mg by mouth daily., Disp: , Rfl:  .  loratadine-pseudoephedrine (CLARITIN-D 12 HOUR) 5-120 MG tablet, CLARITIN-D 12 HOUR, 5-120MG  (Oral Tablet Extended Release 12 Hour)  1 Every Day for 0 days  Quantity: 0.00;  Refills: 0   Ordered :21-Aug-2010  Kavin Leech ;  Started 13-Aug-2009 Active, Disp: , Rfl:  .  metFORMIN (GLUCOPHAGE) 1000 MG tablet, Take 1 tablet (1,000 mg total) by mouth 2 (two) times daily with a meal., Disp: 180 tablet, Rfl: 3 .  Misc Natural Products (OSTEO BI-FLEX ADV TRIPLE ST) TABS, Take 1 tablet by mouth daily., Disp: , Rfl:  .  MULTIPLE VITAMINS-MINERALS PO, Take 1 tablet by mouth daily. , Disp: , Rfl:  .  simvastatin (ZOCOR) 20 MG tablet, Take 1 tablet (20 mg total) by mouth at bedtime., Disp: 90 tablet, Rfl: 3  Review of Systems    Constitutional: Negative for fatigue.  Eyes: Negative for blurred vision.  Respiratory: Negative.   Cardiovascular: Negative.   Gastrointestinal: Negative.   Musculoskeletal: Negative.   Psychiatric/Behavioral: Positive for sleep disturbance.    Social History   Tobacco Use  . Smoking status: Former Smoker    Packs/day: 1.00    Years: 25.00    Pack years: 25.00    Types: Cigarettes    Last attempt to quit: 01/05/1997    Years since quitting: 20.3  . Smokeless tobacco: Never Used  . Tobacco comment: Quit in 1988  Substance Use Topics  . Alcohol use: Yes    Comment: occasionally   Objective:   BP 124/80 (BP Location: Left Arm, Patient Position: Sitting, Cuff Size: Large)   Pulse 79   Temp 98.7 F (37.1 C) (Oral)   Wt 214 lb (97.1 kg)   SpO2 97%   BMI 34.54 kg/m  Vitals:   05/19/17 0849  BP: 124/80  Pulse: 79  Temp: 98.7 F (37.1 C)  TempSrc: Oral  SpO2: 97%  Weight: 214 lb (97.1 kg)     Physical Exam  Constitutional: She appears well-developed and well-nourished. No distress.  Neck: Normal range of motion. Neck supple. No JVD present. No tracheal deviation present. No thyromegaly present.  Cardiovascular: Normal rate, regular rhythm and normal heart sounds. Exam reveals no gallop and no friction rub.  No murmur heard.  Pulses:      Dorsalis pedis pulses are 2+ on the right side, and 2+ on the left side.       Posterior tibial pulses are 2+ on the right side, and 2+ on the left side.  Pulmonary/Chest: Effort normal and breath sounds normal. No respiratory distress. She has no wheezes. She has no rales.  Musculoskeletal: She exhibits no edema.       Right foot: There is normal range of motion and no deformity.       Left foot: There is normal range of motion and no deformity.  Feet:  Right Foot:  Protective Sensation: 10 sites tested. 10 sites sensed.  Left Foot:  Protective Sensation: 10 sites tested. 10 sites sensed.  Lymphadenopathy:    She has no  cervical adenopathy.  Skin: She is not diaphoretic.  Psychiatric: She has a normal mood and affect. Her behavior is normal. Judgment and thought content normal.  Vitals reviewed.       Assessment & Plan:     1. Diabetes mellitus without complication (HCC) A1c ordered as below. I will f/u pending results. Continue Metformin  BID.  - HgB A1c  2. Primary insomnia Discussed sleep hygiene and sleep meditation. She would like to try more natural methods and lifestyle changes first. If still having issues she is to call the office and may discuss trazodone.   3. Elevated liver enzymes H/O this. Has made dietary changes. Will check labs as below and f/u pending results. - Comprehensive Metabolic Panel (CMET)       Margaretann Loveless, PA-C  [facility]

## 2017-05-20 LAB — HEMOGLOBIN A1C
Est. average glucose Bld gHb Est-mCnc: 137 mg/dL
HEMOGLOBIN A1C: 6.4 % — AB (ref 4.8–5.6)

## 2017-05-20 LAB — COMPREHENSIVE METABOLIC PANEL
ALT: 22 IU/L (ref 0–32)
AST: 22 IU/L (ref 0–40)
Albumin/Globulin Ratio: 1.6 (ref 1.2–2.2)
Albumin: 4.5 g/dL (ref 3.5–5.5)
Alkaline Phosphatase: 68 IU/L (ref 39–117)
BUN/Creatinine Ratio: 14 (ref 9–23)
BUN: 10 mg/dL (ref 6–24)
Bilirubin Total: 0.3 mg/dL (ref 0.0–1.2)
CO2: 24 mmol/L (ref 20–29)
Calcium: 10.3 mg/dL — ABNORMAL HIGH (ref 8.7–10.2)
Chloride: 99 mmol/L (ref 96–106)
Creatinine, Ser: 0.71 mg/dL (ref 0.57–1.00)
GFR calc Af Amer: 108 mL/min/{1.73_m2} (ref >59)
GFR, EST NON AFRICAN AMERICAN: 94 mL/min/{1.73_m2} (ref >59)
GLOBULIN, TOTAL: 2.8 g/dL (ref 1.5–4.5)
Glucose: 95 mg/dL (ref 65–99)
Potassium: 4.8 mmol/L (ref 3.5–5.2)
SODIUM: 138 mmol/L (ref 134–144)
Total Protein: 7.3 g/dL (ref 6.0–8.5)

## 2017-07-15 LAB — HM DIABETES EYE EXAM

## 2017-07-23 ENCOUNTER — Encounter: Payer: Self-pay | Admitting: Physician Assistant

## 2017-10-19 ENCOUNTER — Other Ambulatory Visit: Payer: Self-pay | Admitting: Physician Assistant

## 2017-10-19 DIAGNOSIS — Z1231 Encounter for screening mammogram for malignant neoplasm of breast: Secondary | ICD-10-CM

## 2017-11-12 ENCOUNTER — Ambulatory Visit
Admission: RE | Admit: 2017-11-12 | Discharge: 2017-11-12 | Disposition: A | Discharge status: Home / Self Care | Payer: BC Managed Care – PPO | Source: Ambulatory Visit | Attending: Physician Assistant | Admitting: Physician Assistant

## 2017-11-12 DIAGNOSIS — Z1231 Encounter for screening mammogram for malignant neoplasm of breast: Secondary | ICD-10-CM | POA: Diagnosis not present

## 2017-11-20 ENCOUNTER — Ambulatory Visit (INDEPENDENT_AMBULATORY_CARE_PROVIDER_SITE_OTHER): Payer: BC Managed Care – PPO | Admitting: Physician Assistant

## 2017-11-20 ENCOUNTER — Encounter: Payer: Self-pay | Admitting: Physician Assistant

## 2017-11-20 VITALS — BP 122/80 | HR 71 | Resp 16 | Ht 66.0 in | Wt 212.4 lb

## 2017-11-20 DIAGNOSIS — Z6834 Body mass index (BMI) 34.0-34.9, adult: Secondary | ICD-10-CM

## 2017-11-20 DIAGNOSIS — Z0000 Encounter for general adult medical examination without abnormal findings: Secondary | ICD-10-CM | POA: Diagnosis not present

## 2017-11-20 DIAGNOSIS — E7800 Pure hypercholesterolemia, unspecified: Secondary | ICD-10-CM | POA: Diagnosis not present

## 2017-11-20 DIAGNOSIS — E119 Type 2 diabetes mellitus without complications: Secondary | ICD-10-CM

## 2017-11-20 DIAGNOSIS — Z23 Encounter for immunization: Secondary | ICD-10-CM

## 2017-11-20 DIAGNOSIS — Z2821 Immunization not carried out because of patient refusal: Secondary | ICD-10-CM

## 2017-11-20 LAB — POCT UA - MICROALBUMIN: Microalbumin Ur, POC: NEGATIVE mg/L

## 2017-11-20 NOTE — Progress Notes (Signed)
Patient: Madison Mason, Female    DOB: 10/19/1955, 62 y.o.   MRN: [id number] Visit Date: 11/20/2017  Today's Provider: Margaretann LovelessJennifer M Burnette, PA-C   Chief Complaint  Patient presents with  . Annual Exam   Subjective:    Annual physical exam Madison Mason is a 62 y.o. female who presents today for health maintenance and complete physical. She feels well. She reports exercising daily. She reports she is sleeping poorly.  11/19/16 CPE 11/21/15 Normal, HPV-Negative 11/12/17 Mammogram:Normal Colonoscopy: Patient had canceled and never rescheduled, declines wanting to schedule this year. -----------------------------------------------------------------   Review of Systems  Constitutional: Negative.   HENT: Negative.   Eyes: Negative.   Respiratory: Negative.   Cardiovascular: Negative.   Gastrointestinal: Negative.   Endocrine: Negative.   Genitourinary: Negative.   Musculoskeletal: Negative.   Skin: Negative.   Allergic/Immunologic: Negative.   Neurological: Negative.   Hematological: Negative.   Psychiatric/Behavioral: Positive for sleep disturbance.    Social History      She  reports that she quit smoking about 20 years ago. Her smoking use included cigarettes. She has a 25.00 pack-year smoking history. She has never used smokeless tobacco. She reports that she drinks alcohol. She reports that she does not use drugs.       Social History   Socioeconomic History  . Marital status: Married    Spouse name: Not on file  . Number of children: Not on file  . Years of education: Not on file  . Highest education level: Not on file  Occupational History  . Not on file  Social Needs  . Financial resource strain: Not on file  . Food insecurity:    Worry: Not on file    Inability: Not on file  . Transportation needs:    Medical: Not on file    Non-medical: Not on file  Tobacco Use  . Smoking status: Former Smoker    Packs/day: 1.00    Years: 25.00    Pack  years: 25.00    Types: Cigarettes    Last attempt to quit: 01/05/1997    Years since quitting: 20.8  . Smokeless tobacco: Never Used  . Tobacco comment: Quit in 1988  Substance and Sexual Activity  . Alcohol use: Yes    Comment: occasionally  . Drug use: No  . Sexual activity: Yes  Lifestyle  . Physical activity:    Days per week: Not on file    Minutes per session: Not on file  . Stress: Not on file  Relationships  . Social connections:    Talks on phone: Patient refused    Gets together: Patient refused    Attends religious service: Patient refused    Active member of club or organization: Patient refused    Attends meetings of clubs or organizations: Patient refused    Relationship status: Patient refused  Other Topics Concern  . Not on file  Social History Narrative  . Not on file    Past Medical History:  Diagnosis Date  . GERD (gastroesophageal reflux disease)   . Hyperlipidemia      Patient Active Problem List   Diagnosis Date Noted  . Cannot sleep 10/31/2014  . Adult BMI 30+ 10/31/2014  . Avitaminosis D 10/31/2014  . Diabetes mellitus without complication (HCC) 10/31/2014  . Current tobacco use 07/26/2008  . Dermatologic disease 07/14/2007  . Hypercholesteremia 05/22/2006  . Allergic rhinitis 03/24/2006    Past Surgical History:  Procedure Laterality  Date  . CESAREAN SECTION  1989 and 23    Family History        Family Status  Relation Name Status  . Father  Alive  . Mother  Alive  . Sister  Alive  . Sister  Alive  . Sister  Alive  . Mat Aunt  (Not Specified)  . Mat Aunt  (Not Specified)        Her family history includes Breast cancer in her maternal aunt and maternal aunt; Cancer in her father; Diabetes in her mother; Healthy in her sister, sister, and sister; Heart disease in her father.      Allergies  Allergen Reactions  . Sulfa Antibiotics Rash     Current Outpatient Medications:  .  cholecalciferol (VITAMIN D) 1000 UNITS  tablet, Take 1,000 Units by mouth daily., Disp: , Rfl:  .  famotidine (PEPCID) 10 MG tablet, Take 10 mg by mouth daily., Disp: , Rfl:  .  loratadine-pseudoephedrine (CLARITIN-D 12 HOUR) 5-120 MG tablet, CLARITIN-D 12 HOUR, 5-120MG  (Oral Tablet Extended Release 12 Hour)  1 Every Day for 0 days  Quantity: 0.00;  Refills: 0   Ordered :21-Aug-2010  Kavin Leech ;  Started 13-Aug-2009 Active, Disp: , Rfl:  .  metFORMIN (GLUCOPHAGE) 1000 MG tablet, Take 1 tablet (1,000 mg total) by mouth 2 (two) times daily with a meal., Disp: 180 tablet, Rfl: 3 .  Misc Natural Products (OSTEO BI-FLEX ADV TRIPLE ST) TABS, Take 1 tablet by mouth daily., Disp: , Rfl:  .  MULTIPLE VITAMINS-MINERALS PO, Take 1 tablet by mouth daily. , Disp: , Rfl:  .  simvastatin (ZOCOR) 20 MG tablet, Take 1 tablet (20 mg total) by mouth at bedtime., Disp: 90 tablet, Rfl: 3   Patient Care Team: Margaretann Loveless, PA-C as PCP - General (Family Medicine)      Objective:   Vitals: BP 122/80 (BP Location: Left Arm, Patient Position: Sitting, Cuff Size: Normal)   Pulse 71   Resp 16   Ht 5\' 6"  (1.676 m)   Wt 212 lb 6.4 oz (96.3 kg)   SpO2 98%   BMI 34.28 kg/m    Vitals:   11/20/17 0822  BP: 122/80  Pulse: 71  Resp: 16  SpO2: 98%  Weight: 212 lb 6.4 oz (96.3 kg)  Height: 5\' 6"  (1.676 m)     Physical Exam  Constitutional: She is oriented to person, place, and time. She appears well-developed and well-nourished. No distress.  HENT:  Head: Normocephalic and atraumatic.  Right Ear: Hearing, tympanic membrane, external ear and ear canal normal.  Left Ear: Hearing, tympanic membrane, external ear and ear canal normal.  Nose: Nose normal.  Mouth/Throat: Uvula is midline, oropharynx is clear and moist and mucous membranes are normal. No oropharyngeal exudate.  Eyes: Pupils are equal, round, and reactive to light. Conjunctivae and EOM are normal. Right eye exhibits no discharge. Left eye exhibits no discharge. No scleral icterus.    Neck: Normal range of motion. Neck supple. No JVD present. Carotid bruit is not present. No tracheal deviation present. No thyromegaly present.  Cardiovascular: Normal rate, regular rhythm, normal heart sounds and intact distal pulses. Exam reveals no gallop and no friction rub.  No murmur heard. Pulmonary/Chest: Effort normal and breath sounds normal. No respiratory distress. She has no wheezes. She has no rales. She exhibits no tenderness.  Abdominal: Soft. Bowel sounds are normal. She exhibits no distension and no mass. There is no tenderness. There is no rebound and  no guarding.  Musculoskeletal: Normal range of motion. She exhibits no edema or tenderness.  Lymphadenopathy:    She has no cervical adenopathy.  Neurological: She is alert and oriented to person, place, and time.  Skin: Skin is warm and dry. No rash noted. She is not diaphoretic.  Psychiatric: She has a normal mood and affect. Her behavior is normal. Judgment and thought content normal.  Vitals reviewed.  Diabetic Foot Exam - Simple   Simple Foot Form Diabetic Foot exam was performed with the following findings:  Yes 11/20/2017  9:03 AM  Visual Inspection No deformities, no ulcerations, no other skin breakdown bilaterally:  Yes Sensation Testing Intact to touch and monofilament testing bilaterally:  Yes Pulse Check Posterior Tibialis and Dorsalis pulse intact bilaterally:  Yes Comments     Depression Screen PHQ 2/9 Scores 11/20/2017 11/19/2016 05/19/2016 10/31/2014  PHQ - 2 Score 0 0 0 0  PHQ- 9 Score - - 2 -      Assessment & Plan:     Routine Health Maintenance and Physical Exam  Exercise Activities and Dietary recommendations Goals    . Exercise 150 minutes per week (moderate activity)       Immunization History  Administered Date(s) Administered  . Tdap 07/14/2007    Health Maintenance  Topic Date Due  . PNEUMOCOCCAL POLYSACCHARIDE VACCINE AGE 68-64 HIGH RISK  12/10/1959  . FOOT EXAM   05/19/2017  . URINE MICROALBUMIN  05/19/2017  . TETANUS/TDAP  07/13/2017  . HEMOGLOBIN A1C  11/19/2017  . COLONOSCOPY  05/20/2018 (Originally 12/10/2007)  . OPHTHALMOLOGY EXAM  07/16/2018  . PAP SMEAR  11/15/2018  . MAMMOGRAM  11/13/2019  . Hepatitis C Screening  Completed  . HIV Screening  Discontinued    Discussed health benefits of physical activity, and encouraged her to engage in regular exercise appropriate for her age and condition.    1. Annual physical exam Normal physical exam today. Will check labs as below and f/u pending lab results. If labs are stable and WNL she will not need to have these rechecked for one year at her next annual physical exam. She is to call the office in the meantime if she has any acute issue, questions or concerns. - Comprehensive metabolic panel - CBC with Differential/Platelet - TSH  2. Diabetes mellitus without complication (HCC) Stable. Continue metformin 1000 mg BID. Microalbumin negative.  - Hemoglobin A1c - POCT UA - Microalbumin  3. Pure hypercholesterolemia Stable. Continue simvastatin 20mg  daily.  - Lipid panel  4. Need for tetanus booster Td Vaccine given to patient without complications. Patient sat for 15 minutes after administration and was tolerated well without adverse effects. - Td : Tetanus/diphtheria >7yo Preservative  free  6. BMI 34.0-34.9,adult Counseled patient on healthy lifestyle modifications including dieting and exercise.   7. 23-polyvalent pneumococcal polysaccharide vaccine declined  8. Influenza vaccination declined  --------------------------------------------------------------------    Margaretann Loveless, PA-C  [facility]

## 2017-11-20 NOTE — Patient Instructions (Signed)
Health Maintenance for Postmenopausal Women Menopause is a normal process in which your reproductive ability comes to an end. This process happens gradually over a span of months to years, usually between the ages of 22 and 9. Menopause is complete when you have missed 12 consecutive menstrual periods. It is important to talk with your health care provider about some of the most common conditions that affect postmenopausal women, such as heart disease, cancer, and bone loss (osteoporosis). Adopting a healthy lifestyle and getting preventive care can help to promote your health and wellness. Those actions can also lower your chances of developing some of these common conditions. What should I know about menopause? During menopause, you may experience a number of symptoms, such as:  Moderate-to-severe hot flashes.  Night sweats.  Decrease in sex drive.  Mood swings.  Headaches.  Tiredness.  Irritability.  Memory problems.  Insomnia.  Choosing to treat or not to treat menopausal changes is an individual decision that you make with your health care provider. What should I know about hormone replacement therapy and supplements? Hormone therapy products are effective for treating symptoms that are associated with menopause, such as hot flashes and night sweats. Hormone replacement carries certain risks, especially as you become older. If you are thinking about using estrogen or estrogen with progestin treatments, discuss the benefits and risks with your health care provider. What should I know about heart disease and stroke? Heart disease, heart attack, and stroke become more likely as you age. This may be due, in part, to the hormonal changes that your body experiences during menopause. These can affect how your body processes dietary fats, triglycerides, and cholesterol. Heart attack and stroke are both medical emergencies. There are many things that you can do to help prevent heart disease  and stroke:  Have your blood pressure checked at least every 1-2 years. High blood pressure causes heart disease and increases the risk of stroke.  If you are 53-22 years old, ask your health care provider if you should take aspirin to prevent a heart attack or a stroke.  Do not use any tobacco products, including cigarettes, chewing tobacco, or electronic cigarettes. If you need help quitting, ask your health care provider.  It is important to eat a healthy diet and maintain a healthy weight. ? Be sure to include plenty of vegetables, fruits, low-fat dairy products, and lean protein. ? Avoid eating foods that are high in solid fats, added sugars, or salt (sodium).  Get regular exercise. This is one of the most important things that you can do for your health. ? Try to exercise for at least 150 minutes each week. The type of exercise that you do should increase your heart rate and make you sweat. This is known as moderate-intensity exercise. ? Try to do strengthening exercises at least twice each week. Do these in addition to the moderate-intensity exercise.  Know your numbers.Ask your health care provider to check your cholesterol and your blood glucose. Continue to have your blood tested as directed by your health care provider.  What should I know about cancer screening? There are several types of cancer. Take the following steps to reduce your risk and to catch any cancer development as early as possible. Breast Cancer  Practice breast self-awareness. ? This means understanding how your breasts normally appear and feel. ? It also means doing regular breast self-exams. Let your health care provider know about any changes, no matter how small.  If you are 40  or older, have a clinician do a breast exam (clinical breast exam or CBE) every year. Depending on your age, family history, and medical history, it may be recommended that you also have a yearly breast X-ray (mammogram).  If you  have a family history of breast cancer, talk with your health care provider about genetic screening.  If you are at high risk for breast cancer, talk with your health care provider about having an MRI and a mammogram every year.  Breast cancer (BRCA) gene test is recommended for women who have family members with BRCA-related cancers. Results of the assessment will determine the need for genetic counseling and BRCA1 and for BRCA2 testing. BRCA-related cancers include these types: ? Breast. This occurs in males or females. ? Ovarian. ? Tubal. This may also be called fallopian tube cancer. ? Cancer of the abdominal or pelvic lining (peritoneal cancer). ? Prostate. ? Pancreatic.  Cervical, Uterine, and Ovarian Cancer Your health care provider may recommend that you be screened regularly for cancer of the pelvic organs. These include your ovaries, uterus, and vagina. This screening involves a pelvic exam, which includes checking for microscopic changes to the surface of your cervix (Pap test).  For women ages 21-65, health care providers may recommend a pelvic exam and a Pap test every three years. For women ages 79-65, they may recommend the Pap test and pelvic exam, combined with testing for human papilloma virus (HPV), every five years. Some types of HPV increase your risk of cervical cancer. Testing for HPV may also be done on women of any age who have unclear Pap test results.  Other health care providers may not recommend any screening for nonpregnant women who are considered low risk for pelvic cancer and have no symptoms. Ask your health care provider if a screening pelvic exam is right for you.  If you have had past treatment for cervical cancer or a condition that could lead to cancer, you need Pap tests and screening for cancer for at least 20 years after your treatment. If Pap tests have been discontinued for you, your risk factors (such as having a new sexual partner) need to be  reassessed to determine if you should start having screenings again. Some women have medical problems that increase the chance of getting cervical cancer. In these cases, your health care provider may recommend that you have screening and Pap tests more often.  If you have a family history of uterine cancer or ovarian cancer, talk with your health care provider about genetic screening.  If you have vaginal bleeding after reaching menopause, tell your health care provider.  There are currently no reliable tests available to screen for ovarian cancer.  Lung Cancer Lung cancer screening is recommended for adults 69-62 years old who are at high risk for lung cancer because of a history of smoking. A yearly low-dose CT scan of the lungs is recommended if you:  Currently smoke.  Have a history of at least 30 pack-years of smoking and you currently smoke or have quit within the past 15 years. A pack-year is smoking an average of one pack of cigarettes per day for one year.  Yearly screening should:  Continue until it has been 15 years since you quit.  Stop if you develop a health problem that would prevent you from having lung cancer treatment.  Colorectal Cancer  This type of cancer can be detected and can often be prevented.  Routine colorectal cancer screening usually begins at  age 42 and continues through age 45.  If you have risk factors for colon cancer, your health care provider may recommend that you be screened at an earlier age.  If you have a family history of colorectal cancer, talk with your health care provider about genetic screening.  Your health care provider may also recommend using home test kits to check for hidden blood in your stool.  A small camera at the end of a tube can be used to examine your colon directly (sigmoidoscopy or colonoscopy). This is done to check for the earliest forms of colorectal cancer.  Direct examination of the colon should be repeated every  5-10 years until age 71. However, if early forms of precancerous polyps or small growths are found or if you have a family history or genetic risk for colorectal cancer, you may need to be screened more often.  Skin Cancer  Check your skin from head to toe regularly.  Monitor any moles. Be sure to tell your health care provider: ? About any new moles or changes in moles, especially if there is a change in a mole's shape or color. ? If you have a mole that is larger than the size of a pencil eraser.  If any of your family members has a history of skin cancer, especially at a young age, talk with your health care provider about genetic screening.  Always use sunscreen. Apply sunscreen liberally and repeatedly throughout the day.  Whenever you are outside, protect yourself by wearing long sleeves, pants, a wide-brimmed hat, and sunglasses.  What should I know about osteoporosis? Osteoporosis is a condition in which bone destruction happens more quickly than new bone creation. After menopause, you may be at an increased risk for osteoporosis. To help prevent osteoporosis or the bone fractures that can happen because of osteoporosis, the following is recommended:  If you are 46-71 years old, get at least 1,000 mg of calcium and at least 600 mg of vitamin D per day.  If you are older than age 55 but younger than age 65, get at least 1,200 mg of calcium and at least 600 mg of vitamin D per day.  If you are older than age 54, get at least 1,200 mg of calcium and at least 800 mg of vitamin D per day.  Smoking and excessive alcohol intake increase the risk of osteoporosis. Eat foods that are rich in calcium and vitamin D, and do weight-bearing exercises several times each week as directed by your health care provider. What should I know about how menopause affects my mental health? Depression may occur at any age, but it is more common as you become older. Common symptoms of depression  include:  Low or sad mood.  Changes in sleep patterns.  Changes in appetite or eating patterns.  Feeling an overall lack of motivation or enjoyment of activities that you previously enjoyed.  Frequent crying spells.  Talk with your health care provider if you think that you are experiencing depression. What should I know about immunizations? It is important that you get and maintain your immunizations. These include:  Tetanus, diphtheria, and pertussis (Tdap) booster vaccine.  Influenza every year before the flu season begins.  Pneumonia vaccine.  Shingles vaccine.  Your health care provider may also recommend other immunizations. This information is not intended to replace advice given to you by your health care provider. Make sure you discuss any questions you have with your health care provider. Document Released: 02/14/2005  Document Revised: 07/13/2015 Document Reviewed: 09/26/2014 Elsevier Interactive Patient Education  2018 [employer]  

## 2017-11-21 LAB — CBC WITH DIFFERENTIAL/PLATELET
BASOS ABS: 0.1 10*3/uL (ref 0.0–0.2)
Basos: 1 %
EOS (ABSOLUTE): 0.2 10*3/uL (ref 0.0–0.4)
EOS: 3 %
Hematocrit: 38.2 % (ref 34.0–46.6)
Hemoglobin: 12.6 g/dL (ref 11.1–15.9)
IMMATURE GRANULOCYTES: 0 %
Immature Grans (Abs): 0 10*3/uL (ref 0.0–0.1)
Lymphocytes Absolute: 3.5 10*3/uL — ABNORMAL HIGH (ref 0.7–3.1)
Lymphs: 42 %
MCH: 29.1 pg (ref 26.6–33.0)
MCHC: 33 g/dL (ref 31.5–35.7)
MCV: 88 fL (ref 79–97)
MONOS ABS: 0.5 10*3/uL (ref 0.1–0.9)
Monocytes: 5 %
NEUTROS PCT: 49 %
Neutrophils Absolute: 4.1 10*3/uL (ref 1.4–7.0)
PLATELETS: 375 10*3/uL (ref 150–450)
RBC: 4.33 x10E6/uL (ref 3.77–5.28)
RDW: 12.7 % (ref 12.3–15.4)
WBC: 8.3 10*3/uL (ref 3.4–10.8)

## 2017-11-21 LAB — COMPREHENSIVE METABOLIC PANEL
ALK PHOS: 75 IU/L (ref 39–117)
ALT: 25 IU/L (ref 0–32)
AST: 26 IU/L (ref 0–40)
Albumin/Globulin Ratio: 1.7 (ref 1.2–2.2)
Albumin: 4.7 g/dL (ref 3.5–5.5)
BILIRUBIN TOTAL: 0.3 mg/dL (ref 0.0–1.2)
BUN/Creatinine Ratio: 13 (ref 9–23)
BUN: 10 mg/dL (ref 6–24)
CHLORIDE: 93 mmol/L — AB (ref 96–106)
CO2: 24 mmol/L (ref 20–29)
Calcium: 10.4 mg/dL — ABNORMAL HIGH (ref 8.7–10.2)
Creatinine, Ser: 0.79 mg/dL (ref 0.57–1.00)
GFR calc Af Amer: 95 mL/min/{1.73_m2} (ref >59)
GFR calc non Af Amer: 82 mL/min/{1.73_m2} (ref >59)
GLOBULIN, TOTAL: 2.7 g/dL (ref 1.5–4.5)
GLUCOSE: 101 mg/dL — AB (ref 65–99)
Potassium: 4.5 mmol/L (ref 3.5–5.2)
SODIUM: 134 mmol/L (ref 134–144)
Total Protein: 7.4 g/dL (ref 6.0–8.5)

## 2017-11-21 LAB — HEMOGLOBIN A1C
ESTIMATED AVERAGE GLUCOSE: 140 mg/dL
HEMOGLOBIN A1C: 6.5 % — AB (ref 4.8–5.6)

## 2017-11-21 LAB — LIPID PANEL
CHOLESTEROL TOTAL: 193 mg/dL (ref 100–199)
Chol/HDL Ratio: 3.8 ratio (ref 0.0–4.4)
HDL: 51 mg/dL (ref >39)
LDL Calculated: 87 mg/dL (ref 0–99)
TRIGLYCERIDES: 276 mg/dL — AB (ref 0–149)
VLDL Cholesterol Cal: 55 mg/dL — ABNORMAL HIGH (ref 5–40)

## 2017-11-21 LAB — TSH: TSH: 2.13 u[IU]/mL (ref 0.450–4.500)

## 2017-11-23 ENCOUNTER — Telehealth: Payer: Self-pay | Admitting: *Deleted

## 2017-11-23 NOTE — Telephone Encounter (Signed)
LMOVM for pt to return call 

## 2017-11-23 NOTE — Telephone Encounter (Signed)
Patient viewed results in mychart.

## 2017-11-23 NOTE — Telephone Encounter (Signed)
-----   Message from Margaretann LovelessJennifer M Burnette, [place] sent at 11/23/2017  8:11 AM EST ----- Blood count is normal. Kidney and liver function normal. Calcium is stable and actually normal when corrected for albumin. A1c fairly stable but up to 6.5 from 6.4. Cholesterol stable, triglycerides elevated but stable.  Thyroid normal.

## 2018-04-13 ENCOUNTER — Encounter: Payer: Self-pay | Admitting: Physician Assistant

## 2018-04-13 DIAGNOSIS — E119 Type 2 diabetes mellitus without complications: Secondary | ICD-10-CM

## 2018-04-13 DIAGNOSIS — E7800 Pure hypercholesterolemia, unspecified: Secondary | ICD-10-CM

## 2018-04-13 MED ORDER — METFORMIN HCL 1000 MG PO TABS: 1000.0000 mg | ORAL_TABLET | Freq: Two times a day (BID) | ORAL | 3 refills | Status: DC

## 2018-04-13 MED ORDER — SIMVASTATIN 20 MG PO TABS: 20.0000 mg | ORAL_TABLET | Freq: Every day | ORAL | 3 refills | Status: DC

## 2018-05-21 ENCOUNTER — Encounter: Payer: Self-pay | Admitting: Physician Assistant

## 2018-05-21 ENCOUNTER — Other Ambulatory Visit: Payer: Self-pay

## 2018-05-21 ENCOUNTER — Ambulatory Visit: Payer: BC Managed Care – PPO | Admitting: Physician Assistant

## 2018-05-21 VITALS — BP 135/87 | HR 68 | Temp 97.7°F | Resp 16 | Wt 213.4 lb

## 2018-05-21 DIAGNOSIS — E7800 Pure hypercholesterolemia, unspecified: Secondary | ICD-10-CM | POA: Diagnosis not present

## 2018-05-21 DIAGNOSIS — Z6834 Body mass index (BMI) 34.0-34.9, adult: Secondary | ICD-10-CM

## 2018-05-21 DIAGNOSIS — E6609 Other obesity due to excess calories: Secondary | ICD-10-CM

## 2018-05-21 DIAGNOSIS — E119 Type 2 diabetes mellitus without complications: Secondary | ICD-10-CM

## 2018-05-21 MED ORDER — MICROLET NEXT LANCING DEVICE MISC: 1.0000 | Freq: Every day | 3 refills | Status: DC

## 2018-05-21 MED ORDER — MICROLET LANCETS MISC: 0 refills | Status: DC

## 2018-05-21 MED ORDER — CONTOUR NEXT MONITOR W/DEVICE KIT: 1.0000 | PACK | Freq: Every day | 0 refills | Status: DC

## 2018-05-21 MED ORDER — GLUCOSE BLOOD VI STRP: ORAL_STRIP | 4 refills | Status: DC

## 2018-05-21 NOTE — Patient Instructions (Signed)
Diabetes Mellitus and Nutrition, Adult  When you have diabetes (diabetes mellitus), it is very important to have healthy eating habits because your blood sugar (glucose) levels are greatly affected by what you eat and drink. Eating healthy foods in the appropriate amounts, at about the same times every day, can help you:  · Control your blood glucose.  · Lower your risk of heart disease.  · Improve your blood pressure.  · Reach or maintain a healthy weight.  Every person with diabetes is different, and each person has different needs for a meal plan. Your health care provider may recommend that you work with a diet and nutrition specialist (dietitian) to make a meal plan that is best for you. Your meal plan may vary depending on factors such as:  · The calories you need.  · The medicines you take.  · Your weight.  · Your blood glucose, blood pressure, and cholesterol levels.  · Your activity level.  · Other health conditions you have, such as heart or kidney disease.  How do carbohydrates affect me?  Carbohydrates, also called carbs, affect your blood glucose level more than any other type of food. Eating carbs naturally raises the amount of glucose in your blood. Carb counting is a method for keeping track of how many carbs you eat. Counting carbs is important to keep your blood glucose at a healthy level, especially if you use insulin or take certain oral diabetes medicines.  It is important to know how many carbs you can safely have in each meal. This is different for every person. Your dietitian can help you calculate how many carbs you should have at each meal and for each snack.  Foods that contain carbs include:  · Bread, cereal, rice, pasta, and crackers.  · Potatoes and corn.  · Peas, beans, and lentils.  · Milk and yogurt.  · Fruit and juice.  · Desserts, such as cakes, cookies, ice cream, and candy.  How does alcohol affect me?  Alcohol can cause a sudden decrease in blood glucose (hypoglycemia),  especially if you use insulin or take certain oral diabetes medicines. Hypoglycemia can be a life-threatening condition. Symptoms of hypoglycemia (sleepiness, dizziness, and confusion) are similar to symptoms of having too much alcohol.  If your health care provider says that alcohol is safe for you, follow these guidelines:  · Limit alcohol intake to no more than 1 drink per day for nonpregnant women and 2 drinks per day for men. One drink equals 12 oz of beer, 5 oz of wine, or 1½ oz of hard liquor.  · Do not drink on an empty stomach.  · Keep yourself hydrated with water, diet soda, or unsweetened iced tea.  · Keep in mind that regular soda, juice, and other mixers may contain a lot of sugar and must be counted as carbs.  What are tips for following this plan?    Reading food labels  · Start by checking the serving size on the "Nutrition Facts" label of packaged foods and drinks. The amount of calories, carbs, fats, and other nutrients listed on the label is based on one serving of the item. Many items contain more than one serving per package.  · Check the total grams (g) of carbs in one serving. You can calculate the number of servings of carbs in one serving by dividing the total carbs by 15. For example, if a food has 30 g of total carbs, it would be equal to 2   servings of carbs.  · Check the number of grams (g) of saturated and trans fats in one serving. Choose foods that have low or no amount of these fats.  · Check the number of milligrams (mg) of salt (sodium) in one serving. Most people should limit total sodium intake to less than 2,300 mg per day.  · Always check the nutrition information of foods labeled as "low-fat" or "nonfat". These foods may be higher in added sugar or refined carbs and should be avoided.  · Talk to your dietitian to identify your daily goals for nutrients listed on the label.  Shopping  · Avoid buying canned, premade, or processed foods. These foods tend to be high in fat, sodium,  and added sugar.  · Shop around the outside edge of the grocery store. This includes fresh fruits and vegetables, bulk grains, fresh meats, and fresh dairy.  Cooking  · Use low-heat cooking methods, such as baking, instead of high-heat cooking methods like deep frying.  · Cook using healthy oils, such as olive, canola, or sunflower oil.  · Avoid cooking with butter, cream, or high-fat meats.  Meal planning  · Eat meals and snacks regularly, preferably at the same times every day. Avoid going long periods of time without eating.  · Eat foods high in fiber, such as fresh fruits, vegetables, beans, and whole grains. Talk to your dietitian about how many servings of carbs you can eat at each meal.  · Eat 4-6 ounces (oz) of lean protein each day, such as lean meat, chicken, fish, eggs, or tofu. One oz of lean protein is equal to:  ? 1 oz of meat, chicken, or fish.  ? 1 egg.  ? ¼ cup of tofu.  · Eat some foods each day that contain healthy fats, such as avocado, nuts, seeds, and fish.  Lifestyle  · Check your blood glucose regularly.  · Exercise regularly as told by your health care provider. This may include:  ? 150 minutes of moderate-intensity or vigorous-intensity exercise each week. This could be brisk walking, biking, or water aerobics.  ? Stretching and doing strength exercises, such as yoga or weightlifting, at least 2 times a week.  · Take medicines as told by your health care provider.  · Do not use any products that contain nicotine or tobacco, such as cigarettes and e-cigarettes. If you need help quitting, ask your health care provider.  · Work with a counselor or diabetes educator to identify strategies to manage stress and any emotional and social challenges.  Questions to ask a health care provider  · Do I need to meet with a diabetes educator?  · Do I need to meet with a dietitian?  · What number can I call if I have questions?  · When are the best times to check my blood glucose?  Where to find more  information:  · American Diabetes Association: diabetes.org  · Academy of Nutrition and Dietetics: www.eatright.org  · National Institute of Diabetes and Digestive and Kidney Diseases (NIH): www.niddk.nih.gov  Summary  · A healthy meal plan will help you control your blood glucose and maintain a healthy lifestyle.  · Working with a diet and nutrition specialist (dietitian) can help you make a meal plan that is best for you.  · Keep in mind that carbohydrates (carbs) and alcohol have immediate effects on your blood glucose levels. It is important to count carbs and to use alcohol carefully.  This information is not intended to   replace advice given to you by your health care provider. Make sure you discuss any questions you have with your health care provider.  Document Released: 09/19/2004 Document Revised: 07/23/2016 Document Reviewed: 01/28/2016  Elsevier Interactive Patient Education © 2019 [employer]

## 2018-05-21 NOTE — Progress Notes (Signed)
Patient: Madison Mason Female    DOB: 06-Dec-1955   63 y.o.   MRN: [id number] Visit Date: 05/21/2018  Today's Provider: Mar Daring, PA-C   Chief Complaint  Patient presents with  . Follow-up    T2DM   Subjective:     HPI   Diabetes Mellitus Type II, Follow-up:   Lab Results  Component Value Date   HGBA1C 6.5 (H) 11/20/2017   HGBA1C 6.4 (H) 05/19/2017   HGBA1C 6.4 (H) 11/19/2016    Last seen for diabetes 6 months ago.  Management since then includes none.Continue metformin 1000 mg BID She reports excellent compliance with treatment. She is not having side effects.  Current symptoms include none and have been stable. Home blood sugar records: not checking  Most Recent Eye Exam: 07/20219 Weight trend: stable Current exercise: rides her exercise bike Current diet habits: in general, a "healthy" diet    Pertinent Labs:    Component Value Date/Time   CHOL 193 11/20/2017 0920   TRIG 276 (H) 11/20/2017 0920   HDL 51 11/20/2017 0920   LDLCALC 87 11/20/2017 0920   LDLCALC 113 (H) 11/19/2016 1037   CREATININE 0.79 11/20/2017 0920   CREATININE 0.73 11/19/2016 1037    Wt Readings from Last 3 Encounters:  05/21/18 213 lb 6.4 oz (96.8 kg)  11/20/17 212 lb 6.4 oz (96.3 kg)  05/19/17 214 lb (97.1 kg)    ------------------------------------------------------------------------   Allergies  Allergen Reactions  . Sulfa Antibiotics Rash     Current Outpatient Medications:  .  cholecalciferol (VITAMIN D) 1000 UNITS tablet, Take 1,000 Units by mouth daily., Disp: , Rfl:  .  famotidine (PEPCID) 10 MG tablet, Take 10 mg by mouth daily., Disp: , Rfl:  .  loratadine-pseudoephedrine (CLARITIN-D 12 HOUR) 5-120 MG tablet, CLARITIN-D 12 HOUR, 5-120MG (Oral Tablet Extended Release 12 Hour)  1 Every Day for 0 days  Quantity: 0.00;  Refills: 0   Ordered :21-Aug-2010  Ashley Royalty ;  Started 13-Aug-2009 Active, Disp: , Rfl:  .  metFORMIN (GLUCOPHAGE) 1000 MG tablet,  Take 1 tablet (1,000 mg total) by mouth 2 (two) times daily with a meal., Disp: 180 tablet, Rfl: 3 .  Misc Natural Products (OSTEO BI-FLEX ADV TRIPLE ST) TABS, Take 1 tablet by mouth daily., Disp: , Rfl:  .  MULTIPLE VITAMINS-MINERALS PO, Take 1 tablet by mouth daily. , Disp: , Rfl:  .  simvastatin (ZOCOR) 20 MG tablet, Take 1 tablet (20 mg total) by mouth at bedtime., Disp: 90 tablet, Rfl: 3  Review of Systems  Constitutional: Negative.   Respiratory: Negative.   Cardiovascular: Negative.   Endocrine: Negative.   Neurological: Negative.     Social History   Tobacco Use  . Smoking status: Former Smoker    Packs/day: 1.00    Years: 25.00    Pack years: 25.00    Types: Cigarettes    Last attempt to quit: 01/05/1997    Years since quitting: 21.3  . Smokeless tobacco: Never Used  . Tobacco comment: Quit in 1988  Substance Use Topics  . Alcohol use: Yes    Comment: occasionally      Objective:   BP 135/87 (BP Location: Left Arm, Patient Position: Sitting, Cuff Size: Large)   Pulse 68   Temp 97.7 F (36.5 C) (Oral)   Resp 16   Wt 213 lb 6.4 oz (96.8 kg)   BMI 34.44 kg/m  Vitals:   05/21/18 0814  BP: 135/87  Pulse: 68  Resp: 16  Temp: 97.7 F (36.5 C)  TempSrc: Oral  Weight: 213 lb 6.4 oz (96.8 kg)     Physical Exam Vitals signs reviewed.  Constitutional:      General: She is not in acute distress.    Appearance: Normal appearance. She is well-developed. She is obese. She is not ill-appearing or diaphoretic.  Neck:     Musculoskeletal: Normal range of motion and neck supple.     Thyroid: No thyromegaly.     Vascular: No JVD.     Trachea: No tracheal deviation.  Cardiovascular:     Rate and Rhythm: Normal rate and regular rhythm.     Pulses: Normal pulses.     Heart sounds: Normal heart sounds. No murmur. No friction rub. No gallop.   Pulmonary:     Effort: Pulmonary effort is normal. No respiratory distress.     Breath sounds: Normal breath sounds. No  wheezing or rales.  Musculoskeletal:     Right lower leg: No edema.     Left lower leg: No edema.  Lymphadenopathy:     Cervical: No cervical adenopathy.  Neurological:     Mental Status: She is alert.        Assessment & Plan    1. Diabetes mellitus without complication (HCC) Stable. Continue metformin 1030m BID. Will check labs as below and f/u pending results. - Hemoglobin A1c - Lipid panel - Comprehensive metabolic panel - CBC with Differential/Platelet - Blood Glucose Monitoring Suppl (CONTOUR NEXT MONITOR) w/Device KIT; 1 kit by Does not apply route daily.  Dispense: 1 kit; Refill: 0 - glucose blood (CONTOUR NEXT TEST) test strip; To check BS once daily  Dispense: 100 each; Refill: 4 - Lancet Devices (MICROLET NEXT LANCING DEVICE) MISC; 1 Stick by Does not apply route daily. Check BS once daily  Dispense: 100 each; Refill: 3 - Microlet Lancets MISC; To check BS once daily  Dispense: 100 each; Refill: 0  2. Hypercholesteremia Stable. Continue simvastatin 23m Will check labs as below and f/u pending results. - Hemoglobin A1c - Lipid panel - Comprehensive metabolic panel - CBC with Differential/Platelet  3. Class 1 obesity due to excess calories with serious comorbidity and body mass index (BMI) of 34.0 to 34.9 in adult Counseled patient on healthy lifestyle modifications including dieting and exercise.  - Hemoglobin A1c - Lipid panel - Comprehensive metabolic panel - CBC with Differential/Platelet     JeMar DaringPA-C  [facility]

## 2018-05-22 LAB — COMPREHENSIVE METABOLIC PANEL
ALT: 26 IU/L (ref 0–32)
AST: 24 IU/L (ref 0–40)
Albumin/Globulin Ratio: 1.9 (ref 1.2–2.2)
Albumin: 4.5 g/dL (ref 3.8–4.9)
Alkaline Phosphatase: 70 IU/L (ref 39–117)
BUN/Creatinine Ratio: 15 (ref 12–28)
BUN: 11 mg/dL (ref 8–27)
Bilirubin Total: 0.3 mg/dL (ref 0.0–1.2)
CO2: 24 mmol/L (ref 20–29)
Calcium: 10.4 mg/dL — ABNORMAL HIGH (ref 8.7–10.3)
Chloride: 98 mmol/L (ref 96–106)
Creatinine, Ser: 0.74 mg/dL (ref 0.57–1.00)
GFR calc Af Amer: 102 mL/min/{1.73_m2} (ref >59)
GFR calc non Af Amer: 88 mL/min/{1.73_m2} (ref >59)
Globulin, Total: 2.4 g/dL (ref 1.5–4.5)
Glucose: 103 mg/dL — ABNORMAL HIGH (ref 65–99)
Potassium: 4.5 mmol/L (ref 3.5–5.2)
Sodium: 137 mmol/L (ref 134–144)
Total Protein: 6.9 g/dL (ref 6.0–8.5)

## 2018-05-22 LAB — CBC WITH DIFFERENTIAL/PLATELET
Basophils Absolute: 0.1 10*3/uL (ref 0.0–0.2)
Basos: 1 %
EOS (ABSOLUTE): 0.3 10*3/uL (ref 0.0–0.4)
Eos: 4 %
Hematocrit: 37.3 % (ref 34.0–46.6)
Hemoglobin: 12.4 g/dL (ref 11.1–15.9)
Immature Grans (Abs): 0 10*3/uL (ref 0.0–0.1)
Immature Granulocytes: 1 %
Lymphocytes Absolute: 3.6 10*3/uL — ABNORMAL HIGH (ref 0.7–3.1)
Lymphs: 44 %
MCH: 28.8 pg (ref 26.6–33.0)
MCHC: 33.2 g/dL (ref 31.5–35.7)
MCV: 87 fL (ref 79–97)
Monocytes Absolute: 0.5 10*3/uL (ref 0.1–0.9)
Monocytes: 6 %
Neutrophils Absolute: 3.6 10*3/uL (ref 1.4–7.0)
Neutrophils: 44 %
Platelets: 362 10*3/uL (ref 150–450)
RBC: 4.31 x10E6/uL (ref 3.77–5.28)
RDW: 13 % (ref 11.7–15.4)
WBC: 8.2 10*3/uL (ref 3.4–10.8)

## 2018-05-22 LAB — LIPID PANEL
Chol/HDL Ratio: 3.8 ratio (ref 0.0–4.4)
Cholesterol, Total: 196 mg/dL (ref 100–199)
HDL: 51 mg/dL (ref >39)
LDL Calculated: 83 mg/dL (ref 0–99)
Triglycerides: 308 mg/dL — ABNORMAL HIGH (ref 0–149)
VLDL Cholesterol Cal: 62 mg/dL — ABNORMAL HIGH (ref 5–40)

## 2018-05-22 LAB — HEMOGLOBIN A1C
Est. average glucose Bld gHb Est-mCnc: 140 mg/dL
Hgb A1c MFr Bld: 6.5 % — ABNORMAL HIGH (ref 4.8–5.6)

## 2018-06-01 ENCOUNTER — Telehealth: Payer: Self-pay

## 2018-06-01 NOTE — Telephone Encounter (Signed)
Left message to call back regarding lab results.

## 2018-06-01 NOTE — Telephone Encounter (Signed)
-----   Message from Jennifer M Burnette, PA-C sent at 06/01/2018  1:08 PM EDT ----- A1c stable at 6.5. Cholesterol essentially stable as well. Triglycerides (portion of cholesterol most closely related to dietary habits is still elevated at 308; like below 150). Kidney and liver function are normal. Blood count is stable. 

## 2018-06-02 ENCOUNTER — Encounter: Payer: Self-pay | Admitting: Physician Assistant

## 2018-06-02 DIAGNOSIS — E119 Type 2 diabetes mellitus without complications: Secondary | ICD-10-CM

## 2018-06-02 MED ORDER — ONETOUCH ULTRASOFT LANCETS MISC: 12 refills | Status: DC

## 2018-06-02 MED ORDER — GLUCOSE BLOOD VI STRP: ORAL_STRIP | 12 refills | Status: DC

## 2018-06-02 MED ORDER — ONETOUCH ULTRALINK W/DEVICE KIT: PACK | 0 refills | Status: DC

## 2018-06-02 NOTE — Telephone Encounter (Signed)
Viewed by Felecia Jan on 06/01/2018 9:02 PM  Written by Margaretann Loveless, PA-C on 06/01/2018 1:08 PM  A1c stable at 6.5. Cholesterol essentially stable as well. Triglycerides (portion of cholesterol most closely related to dietary habits is still elevated at 308; like below 150). Kidney and liver function are normal. Blood count is stable.

## 2018-06-02 NOTE — Telephone Encounter (Signed)
-----   Message from Margaretann Loveless, PA-C sent at 06/01/2018  1:08 PM EDT ----- A1c stable at 6.5. Cholesterol essentially stable as well. Triglycerides (portion of cholesterol most closely related to dietary habits is still elevated at 308; like below 150). Kidney and liver function are normal. Blood count is stable.

## 2018-09-28 ENCOUNTER — Other Ambulatory Visit: Payer: Self-pay | Admitting: Physician Assistant

## 2018-09-28 DIAGNOSIS — Z1231 Encounter for screening mammogram for malignant neoplasm of breast: Secondary | ICD-10-CM

## 2018-10-14 LAB — HM DIABETES EYE EXAM

## 2018-10-19 ENCOUNTER — Encounter: Payer: Self-pay | Admitting: Physician Assistant

## 2018-11-18 ENCOUNTER — Ambulatory Visit
Admission: RE | Admit: 2018-11-18 | Discharge: 2018-11-18 | Disposition: A | Discharge status: Home / Self Care | Payer: BC Managed Care – PPO | Source: Ambulatory Visit | Attending: Physician Assistant | Admitting: Physician Assistant

## 2018-11-18 DIAGNOSIS — Z1231 Encounter for screening mammogram for malignant neoplasm of breast: Secondary | ICD-10-CM | POA: Diagnosis not present

## 2018-11-19 ENCOUNTER — Telehealth: Payer: Self-pay

## 2018-11-19 NOTE — Telephone Encounter (Signed)
Viewed by Freddy Jaksch on 11/19/2018 10:15 AM

## 2018-11-19 NOTE — Telephone Encounter (Signed)
-----   Message from Jennifer M Burnette, PA-C sent at 11/19/2018 10:10 AM EST ----- Normal mammogram. Repeat screening in one year. 

## 2018-11-24 ENCOUNTER — Ambulatory Visit (INDEPENDENT_AMBULATORY_CARE_PROVIDER_SITE_OTHER): Payer: BC Managed Care – PPO | Admitting: Physician Assistant

## 2018-11-24 ENCOUNTER — Encounter: Payer: Self-pay | Admitting: Physician Assistant

## 2018-11-24 ENCOUNTER — Other Ambulatory Visit (HOSPITAL_COMMUNITY)
Admission: RE | Admit: 2018-11-24 | Discharge: 2018-11-24 | Disposition: A | Discharge status: Home / Self Care | Payer: BC Managed Care – PPO | Source: Ambulatory Visit | Attending: Physician Assistant | Admitting: Physician Assistant

## 2018-11-24 ENCOUNTER — Other Ambulatory Visit: Payer: Self-pay

## 2018-11-24 VITALS — BP 128/82 | HR 76 | Temp 96.9°F | Resp 16 | Ht 66.0 in | Wt 211.4 lb

## 2018-11-24 DIAGNOSIS — Z124 Encounter for screening for malignant neoplasm of cervix: Secondary | ICD-10-CM

## 2018-11-24 DIAGNOSIS — Z0000 Encounter for general adult medical examination without abnormal findings: Secondary | ICD-10-CM | POA: Diagnosis not present

## 2018-11-24 DIAGNOSIS — E119 Type 2 diabetes mellitus without complications: Secondary | ICD-10-CM | POA: Diagnosis not present

## 2018-11-24 DIAGNOSIS — Z1211 Encounter for screening for malignant neoplasm of colon: Secondary | ICD-10-CM

## 2018-11-24 DIAGNOSIS — E6609 Other obesity due to excess calories: Secondary | ICD-10-CM

## 2018-11-24 DIAGNOSIS — E7800 Pure hypercholesterolemia, unspecified: Secondary | ICD-10-CM

## 2018-11-24 DIAGNOSIS — Z6834 Body mass index (BMI) 34.0-34.9, adult: Secondary | ICD-10-CM

## 2018-11-24 LAB — POCT UA - MICROALBUMIN: Microalbumin Ur, POC: NEGATIVE mg/L

## 2018-11-24 NOTE — Patient Instructions (Signed)

## 2018-11-24 NOTE — Progress Notes (Signed)
Patient: Madison Mason, Female    DOB: 1955/09/16, 63 y.o.   MRN: [id number] Visit Date: 11/24/2018  Today's Provider: Mar Daring, PA-C   Chief Complaint  Patient presents with  . Annual Exam   Subjective:     Annual physical exam Madison Mason is a 63 y.o. female who presents today for health maintenance and complete physical. She feels fairly well. She reports exercising, ride her exercise bike. She reports she is sleeping fairly well. ----------------------------------------------------------------- 11/19/2018-Normal mammogram. Repeat screening in one year. 11/21/2015 -Pap is normal, HPV negative. Will repeat in 3-5 years.  Review of Systems  Constitutional: Negative.   HENT: Positive for tinnitus.   Eyes: Negative.   Respiratory: Negative.   Cardiovascular: Negative.   Gastrointestinal: Negative.   Endocrine: Negative.   Genitourinary: Negative.   Musculoskeletal: Negative.   Skin: Negative.   Allergic/Immunologic: Negative.   Neurological: Negative.   Hematological: Negative.   Psychiatric/Behavioral: Negative.     Social History      She  reports that she quit smoking about 21 years ago. Her smoking use included cigarettes. She has a 25.00 pack-year smoking history. She has never used smokeless tobacco. She reports current alcohol use. She reports that she does not use drugs.       Social History   Socioeconomic History  . Marital status: Married    Spouse name: Not on file  . Number of children: Not on file  . Years of education: Not on file  . Highest education level: Not on file  Occupational History  . Not on file  Social Needs  . Financial resource strain: Not on file  . Food insecurity    Worry: Not on file    Inability: Not on file  . Transportation needs    Medical: Not on file    Non-medical: Not on file  Tobacco Use  . Smoking status: Former Smoker    Packs/day: 1.00    Years: 25.00    Pack years: 25.00    Types:  Cigarettes    Quit date: 01/05/1997    Years since quitting: 21.8  . Smokeless tobacco: Never Used  . Tobacco comment: Quit in 1988  Substance and Sexual Activity  . Alcohol use: Yes    Comment: occasionally  . Drug use: No  . Sexual activity: Yes  Lifestyle  . Physical activity    Days per week: Not on file    Minutes per session: Not on file  . Stress: Not on file  Relationships  . Social Herbalist on phone: Patient refused    Gets together: Patient refused    Attends religious service: Patient refused    Active member of club or organization: Patient refused    Attends meetings of clubs or organizations: Patient refused    Relationship status: Patient refused  Other Topics Concern  . Not on file  Social History Narrative  . Not on file    Past Medical History:  Diagnosis Date  . GERD (gastroesophageal reflux disease)   . Hyperlipidemia      Patient Active Problem List   Diagnosis Date Noted  . Cannot sleep 10/31/2014  . Adult BMI 30+ 10/31/2014  . Avitaminosis D 10/31/2014  . Diabetes mellitus without complication ([place]) 76/73/4193  . Current tobacco use 07/26/2008  . Dermatologic disease 07/14/2007  . Hypercholesteremia 05/22/2006  . Allergic rhinitis 03/24/2006    Past Surgical History:  Procedure Laterality  Date  . [place] and 31    Family History        Family Status  Relation Name Status  . Father  Alive  . Mother  Alive  . Sister  Alive  . Sister  Alive  . Sister  Alive  . Mat Aunt  (Not Specified)  . Mat Aunt  (Not Specified)        Her family history includes Breast cancer in her maternal aunt and maternal aunt; Cancer in her father; Diabetes in her mother; Healthy in her sister, sister, and sister; Heart disease in her father.      Allergies  Allergen Reactions  . Sulfa Antibiotics Rash     Current Outpatient Medications:  .  Blood Glucose Monitoring Suppl (ONETOUCH ULTRALINK) w/Device KIT, To check  blood sugar once daily., Disp: 1 each, Rfl: 0 .  cholecalciferol (VITAMIN D) 1000 UNITS tablet, Take 1,000 Units by mouth daily., Disp: , Rfl:  .  famotidine (PEPCID) 10 MG tablet, Take 10 mg by mouth daily., Disp: , Rfl:  .  glucose blood (ONETOUCH ULTRA) test strip, To check blood sugar once daily., Disp: 100 each, Rfl: 12 .  Lancets (ONETOUCH ULTRASOFT) lancets, To check blood sugar once daily., Disp: 100 each, Rfl: 12 .  loratadine-pseudoephedrine (CLARITIN-D 12 HOUR) 5-120 MG tablet, CLARITIN-D 12 HOUR, 5-120MG (Oral Tablet Extended Release 12 Hour)  1 Every Day for 0 days  Quantity: 0.00;  Refills: 0   Ordered :21-Aug-2010  Ashley Royalty ;  Started 13-Aug-2009 Active, Disp: , Rfl:  .  metFORMIN (GLUCOPHAGE) 1000 MG tablet, Take 1 tablet (1,000 mg total) by mouth 2 (two) times daily with a meal., Disp: 180 tablet, Rfl: 3 .  Misc Natural Products (OSTEO BI-FLEX ADV TRIPLE ST) TABS, Take 1 tablet by mouth daily., Disp: , Rfl:  .  MULTIPLE VITAMINS-MINERALS PO, Take 1 tablet by mouth daily. , Disp: , Rfl:  .  simvastatin (ZOCOR) 20 MG tablet, Take 1 tablet (20 mg total) by mouth at bedtime., Disp: 90 tablet, Rfl: 3   Patient Care Team: Mar Daring, PA-C as PCP - General (Family Medicine)    Objective:    Vitals: BP 128/82 (BP Location: Left Arm, Patient Position: Sitting, Cuff Size: Large)   Pulse 76   Temp (!) 96.9 F (36.1 C) (Temporal)   Resp 16   Ht '5\' 6"'  (1.676 m)   Wt 211 lb 6.4 oz (95.9 kg)   BMI 34.12 kg/m    Vitals:   11/24/18 0916  BP: 128/82  Pulse: 76  Resp: 16  Temp: (!) 96.9 F (36.1 C)  TempSrc: Temporal  Weight: 211 lb 6.4 oz (95.9 kg)  Height: '5\' 6"'  (1.676 m)     Physical Exam Vitals signs reviewed.  Constitutional:      General: She is not in acute distress.    Appearance: Normal appearance. She is well-developed. She is obese. She is not ill-appearing or diaphoretic.  HENT:     Head: Normocephalic and atraumatic.     Right Ear: Hearing,  tympanic membrane, ear canal and external ear normal.     Left Ear: Hearing, tympanic membrane, ear canal and external ear normal.     Nose: Nose normal.     Mouth/Throat:     Mouth: Mucous membranes are moist.     Pharynx: Oropharynx is clear. Uvula midline. No oropharyngeal exudate.  Eyes:     General: No scleral icterus.  Right eye: No discharge.        Left eye: No discharge.     Extraocular Movements: Extraocular movements intact.     Conjunctiva/sclera: Conjunctivae normal.     Pupils: Pupils are equal, round, and reactive to light.  Neck:     Musculoskeletal: Normal range of motion and neck supple.     Thyroid: No thyromegaly.     Vascular: No carotid bruit or JVD.     Trachea: No tracheal deviation.  Cardiovascular:     Rate and Rhythm: Normal rate and regular rhythm.     Pulses: Normal pulses.     Heart sounds: Normal heart sounds. No murmur. No friction rub. No gallop.   Pulmonary:     Effort: Pulmonary effort is normal. No respiratory distress.     Breath sounds: Normal breath sounds. No wheezing or rales.  Chest:     Chest wall: No tenderness.     Breasts: Breasts are symmetrical.        Right: No inverted nipple, mass, nipple discharge, skin change or tenderness.        Left: No inverted nipple, mass, nipple discharge, skin change or tenderness.  Abdominal:     General: Bowel sounds are normal. There is no distension.     Palpations: Abdomen is soft. There is no mass.     Tenderness: There is no abdominal tenderness. There is no guarding or rebound.     Hernia: There is no hernia in the left inguinal area.  Genitourinary:    Exam position: Supine.     Labia:        Right: No rash, tenderness, lesion or injury.        Left: No rash, tenderness, lesion or injury.      Vagina: Normal. No signs of injury. No vaginal discharge, erythema, tenderness or bleeding.     Cervix: No cervical motion tenderness, discharge or friability.     Adnexa:        Right: No  mass, tenderness or fullness.         Left: No mass, tenderness or fullness.       Rectum: Normal.  Musculoskeletal: Normal range of motion.        General: No tenderness.     Right lower leg: No edema.     Left lower leg: No edema.  Lymphadenopathy:     Cervical: No cervical adenopathy.  Skin:    General: Skin is warm and dry.     Capillary Refill: Capillary refill takes less than 2 seconds.     Findings: No rash.  Neurological:     General: No focal deficit present.     Mental Status: She is alert and oriented to person, place, and time. Mental status is at baseline.     Cranial Nerves: No cranial nerve deficit.     Coordination: Coordination normal.     Deep Tendon Reflexes: Reflexes are normal and symmetric.  Psychiatric:        Mood and Affect: Mood normal.        Behavior: Behavior normal.        Thought Content: Thought content normal.        Judgment: Judgment normal.    Diabetic Foot Exam - Simple   Simple Foot Form Diabetic Foot exam was performed with the following findings: Yes 11/24/2018 12:47 PM  Visual Inspection No deformities, no ulcerations, no other skin breakdown bilaterally: Yes See comments: Yes Sensation Testing Intact to touch  and monofilament testing bilaterally: Yes Pulse Check Posterior Tibialis and Dorsalis pulse intact bilaterally: Yes Comments Nails are short and well trimmed but are thick and yellowed     Depression Screen PHQ 2/9 Scores 11/24/2018 11/20/2017 11/19/2016 05/19/2016  PHQ - 2 Score 0 0 0 0  PHQ- 9 Score - - - 2       Assessment & Plan:     Routine Health Maintenance and Physical Exam  Exercise Activities and Dietary recommendations Goals    . Exercise 150 minutes per week (moderate activity)       Immunization History  Administered Date(s) Administered  . Td 11/20/2017  . Tdap 07/14/2007    Health Maintenance  Topic Date Due  . PNEUMOCOCCAL POLYSACCHARIDE VACCINE AGE 89-64 HIGH RISK  12/10/1959  .  COLONOSCOPY  12/10/2007  . PAP SMEAR-Modifier  11/15/2018  . FOOT EXAM  11/21/2018  . HEMOGLOBIN A1C  11/21/2018  . URINE MICROALBUMIN  11/21/2018  . OPHTHALMOLOGY EXAM  10/14/2019  . MAMMOGRAM  11/17/2020  . TETANUS/TDAP  11/21/2027  . Hepatitis C Screening  Completed  . HIV Screening  Discontinued     Discussed health benefits of physical activity, and encouraged her to engage in regular exercise appropriate for her age and condition.    1. Encounter for annual physical exam Normal physical exam today. Will check labs as below and f/u pending lab results. If labs are stable and WNL she will not need to have these rechecked for one year at her next annual physical exam. She is to call the office in the meantime if she has any acute issue, questions or concerns. - CBC w/Diff - Comprehensive Metabolic Panel (CMET) - HgB A1c - Lipid Profile - TSH  2. Screening for cervical cancer Pap collected today. Will send as below and f/u pending results. - Cytology - PAP  3. Colon cancer screening No previous colonoscopy. No family history of colon cancer. Cologuard ordered as below.  - Cologuard  4. Type 2 diabetes mellitus without complication, without long-term current use of insulin (HCC) Microalbumin normal. Continue Metformin 1092m BID. Will check labs as below and f/u pending results. - CBC w/Diff - Comprehensive Metabolic Panel (CMET) - HgB A1c - Lipid Profile - TSH - POCT UA - Microalbumin  5. Hypercholesteremia Stable. Continue Simvastatin 251m Will check labs as below and f/u pending results. - CBC w/Diff - Comprehensive Metabolic Panel (CMET) - HgB A1c - Lipid Profile - TSH  6. Class 1 obesity due to excess calories with serious comorbidity and body mass index (BMI) of 34.0 to 34.9 in adult Counseled patient on healthy lifestyle modifications including dieting and exercise.   --------------------------------------------------------------------    JeMar DaringPA-C  [facility]

## 2018-11-25 ENCOUNTER — Telehealth: Payer: Self-pay

## 2018-11-25 LAB — CBC WITH DIFFERENTIAL/PLATELET
Basophils Absolute: 0.1 10*3/uL (ref 0.0–0.2)
Basos: 1 %
EOS (ABSOLUTE): 0.2 10*3/uL (ref 0.0–0.4)
Eos: 3 %
Hematocrit: 38.9 % (ref 34.0–46.6)
Hemoglobin: 12.8 g/dL (ref 11.1–15.9)
Immature Grans (Abs): 0 10*3/uL (ref 0.0–0.1)
Immature Granulocytes: 0 %
Lymphocytes Absolute: 3.3 10*3/uL — ABNORMAL HIGH (ref 0.7–3.1)
Lymphs: 38 %
MCH: 28.9 pg (ref 26.6–33.0)
MCHC: 32.9 g/dL (ref 31.5–35.7)
MCV: 88 fL (ref 79–97)
Monocytes Absolute: 0.5 10*3/uL (ref 0.1–0.9)
Monocytes: 6 %
Neutrophils Absolute: 4.5 10*3/uL (ref 1.4–7.0)
Neutrophils: 52 %
Platelets: 405 10*3/uL (ref 150–450)
RBC: 4.43 x10E6/uL (ref 3.77–5.28)
RDW: 12.6 % (ref 11.7–15.4)
WBC: 8.6 10*3/uL (ref 3.4–10.8)

## 2018-11-25 LAB — HEMOGLOBIN A1C
Est. average glucose Bld gHb Est-mCnc: 143 mg/dL
Hgb A1c MFr Bld: 6.6 % — ABNORMAL HIGH (ref 4.8–5.6)

## 2018-11-25 LAB — COMPREHENSIVE METABOLIC PANEL
ALT: 23 IU/L (ref 0–32)
AST: 27 IU/L (ref 0–40)
Albumin/Globulin Ratio: 1.5 (ref 1.2–2.2)
Albumin: 4.5 g/dL (ref 3.8–4.9)
Alkaline Phosphatase: 80 IU/L (ref 39–117)
BUN/Creatinine Ratio: 17 (ref 12–28)
BUN: 13 mg/dL (ref 8–27)
Bilirubin Total: 0.3 mg/dL (ref 0.0–1.2)
CO2: 24 mmol/L (ref 20–29)
Calcium: 10.5 mg/dL — ABNORMAL HIGH (ref 8.7–10.3)
Chloride: 98 mmol/L (ref 96–106)
Creatinine, Ser: 0.75 mg/dL (ref 0.57–1.00)
GFR calc Af Amer: 100 mL/min/{1.73_m2} (ref >59)
GFR calc non Af Amer: 87 mL/min/{1.73_m2} (ref >59)
Globulin, Total: 3.1 g/dL (ref 1.5–4.5)
Glucose: 103 mg/dL — ABNORMAL HIGH (ref 65–99)
Potassium: 4.9 mmol/L (ref 3.5–5.2)
Sodium: 137 mmol/L (ref 134–144)
Total Protein: 7.6 g/dL (ref 6.0–8.5)

## 2018-11-25 LAB — LIPID PANEL
Chol/HDL Ratio: 4.2 ratio (ref 0.0–4.4)
Cholesterol, Total: 212 mg/dL — ABNORMAL HIGH (ref 100–199)
HDL: 51 mg/dL (ref >39)
LDL Chol Calc (NIH): 109 mg/dL — ABNORMAL HIGH (ref 0–99)
Triglycerides: 302 mg/dL — ABNORMAL HIGH (ref 0–149)
VLDL Cholesterol Cal: 52 mg/dL — ABNORMAL HIGH (ref 5–40)

## 2018-11-25 LAB — TSH: TSH: 1.41 u[IU]/mL (ref 0.450–4.500)

## 2018-11-25 NOTE — Telephone Encounter (Signed)
-----   Message from Mar Daring, [place] sent at 11/25/2018  8:19 AM EST ----- Blood count is normal. Kidney and liver function are normal. Sodium and potassium are normal. Calcium is stable. A1c is 6.6. Slight increase noted, had been 6.5. Cholesterol has also increased. Make sure to take simvastatin 20mg  nightly. If this was also not a fasting lab, it is possible that is why it is slightly higher. Thyroid is normal.

## 2018-11-25 NOTE — Telephone Encounter (Signed)
LMTCB 11/25/2018   Thanks,   -Mickel Baas

## 2018-11-25 NOTE — Telephone Encounter (Signed)
Pt. Called back and given results and instructions. Verbalizes understanding. 

## 2018-11-26 ENCOUNTER — Telehealth: Payer: Self-pay

## 2018-11-26 LAB — CYTOLOGY - PAP
Comment: NEGATIVE
Diagnosis: NEGATIVE
High risk HPV: NEGATIVE

## 2018-11-26 NOTE — Telephone Encounter (Signed)
Patient advised as directed below. 

## 2018-11-26 NOTE — Telephone Encounter (Signed)
-----   Message from Mar Daring, [place] sent at 11/26/2018 11:27 AM EST ----- Pap is normal, HPV negative.  Will repeat in 5 years.

## 2018-12-08 LAB — COLOGUARD: Cologuard: NEGATIVE

## 2018-12-13 ENCOUNTER — Telehealth: Payer: Self-pay

## 2018-12-13 NOTE — Telephone Encounter (Signed)
Viewed by Freddy Jaksch on 12/13/2018 2:48 PM Written by Mar Daring, PA-C on 12/13/2018 2:05 PM Negative cologuard. May repeat in 3 years.

## 2018-12-13 NOTE — Telephone Encounter (Signed)
-----   Message from Mar Daring, [place] sent at 12/13/2018  2:05 PM EST ----- Negative cologuard. May repeat in 3 years.

## 2019-04-16 ENCOUNTER — Encounter: Payer: Self-pay | Admitting: Physician Assistant

## 2019-04-16 DIAGNOSIS — E7800 Pure hypercholesterolemia, unspecified: Secondary | ICD-10-CM

## 2019-04-16 DIAGNOSIS — Z96641 Presence of right artificial hip joint: Secondary | ICD-10-CM

## 2019-04-16 DIAGNOSIS — E119 Type 2 diabetes mellitus without complications: Secondary | ICD-10-CM

## 2019-04-18 MED ORDER — SIMVASTATIN 20 MG PO TABS: 20.0000 mg | ORAL_TABLET | Freq: Every day | ORAL | 3 refills | Status: DC

## 2019-04-18 MED ORDER — AMOXICILLIN 500 MG PO CAPS: ORAL_CAPSULE | ORAL | 0 refills | Status: DC

## 2019-04-18 MED ORDER — METFORMIN HCL 1000 MG PO TABS: 1000.0000 mg | ORAL_TABLET | Freq: Two times a day (BID) | ORAL | 3 refills | Status: DC

## 2019-07-18 ENCOUNTER — Encounter: Payer: Self-pay | Admitting: Physician Assistant

## 2019-07-18 DIAGNOSIS — E119 Type 2 diabetes mellitus without complications: Secondary | ICD-10-CM

## 2019-07-18 MED ORDER — ONETOUCH ULTRASOFT LANCETS MISC: 12 refills | Status: DC

## 2019-07-18 MED ORDER — ONETOUCH ULTRA VI STRP: ORAL_STRIP | 12 refills | Status: DC

## 2019-09-21 ENCOUNTER — Other Ambulatory Visit: Payer: Self-pay | Admitting: Physician Assistant

## 2019-09-21 DIAGNOSIS — E119 Type 2 diabetes mellitus without complications: Secondary | ICD-10-CM

## 2019-10-20 LAB — HM DIABETES EYE EXAM

## 2019-10-26 ENCOUNTER — Encounter: Payer: Self-pay | Admitting: Physician Assistant

## 2019-11-03 ENCOUNTER — Other Ambulatory Visit: Payer: Self-pay | Admitting: Physician Assistant

## 2019-11-03 DIAGNOSIS — Z1231 Encounter for screening mammogram for malignant neoplasm of breast: Secondary | ICD-10-CM

## 2019-11-29 NOTE — Progress Notes (Addendum)
I,April Miller,acting as a scribe for [employer], PA-C.,have documented all relevant documentation on the behalf of Mar Daring, PA-C,as directed by  Mar Daring, PA-C while in the presence of Mar Daring, PA-C.   Complete physical exam   Patient: Madison Mason   DOB: 17-Aug-1955   64 y.o. Female  MRN: [id number] Visit Date: 11/30/2019  Today's healthcare provider: Mar Daring, PA-C   Chief Complaint  Patient presents with  . Annual Exam   Subjective    Madison Mason is a 64 y.o. female who presents today for a complete physical exam.  She reports consuming a general diet. Home exercise routine includes exercise bike. She generally feels well. She reports sleeping poorly. She does not have additional problems to discuss today.  HPI  12/06/18-Negative cologuard. May repeat in 3 years. 11/24/18-Pap is normal, HPV negative. Will repeat in 5 years 12/12/19-Mammogram scheduled. Had both hips replaced this year. Doing well. No more pain.   Past Medical History:  Diagnosis Date  . GERD (gastroesophageal reflux disease)   . Hyperlipidemia    Past Surgical History:  Procedure Laterality Date  . CESAREAN SECTION  1989 and 1986   Social History   Socioeconomic History  . Marital status: Married    Spouse name: Not on file  . Number of children: Not on file  . Years of education: Not on file  . Highest education level: Not on file  Occupational History  . Not on file  Tobacco Use  . Smoking status: Former Smoker    Packs/day: 1.00    Years: 25.00    Pack years: 25.00    Types: Cigarettes    Quit date: 01/05/1997    Years since quitting: 22.9  . Smokeless tobacco: Never Used  . Tobacco comment: Quit in 1988  Substance and Sexual Activity  . Alcohol use: Yes    Comment: occasionally  . Drug use: No  . Sexual activity: Yes  Other Topics Concern  . Not on file  Social History Narrative  . Not on file   Social  Determinants of Health   Financial Resource Strain:   . Difficulty of Paying Living Expenses: Not on file  Food Insecurity:   . Worried About Charity fundraiser in the Last Year: Not on file  . Ran Out of Food in the Last Year: Not on file  Transportation Needs:   . Lack of Transportation (Medical): Not on file  . Lack of Transportation (Non-Medical): Not on file  Physical Activity:   . Days of Exercise per Week: Not on file  . Minutes of Exercise per Session: Not on file  Stress:   . Feeling of Stress : Not on file  Social Connections:   . Frequency of Communication with Friends and Family: Not on file  . Frequency of Social Gatherings with Friends and Family: Not on file  . Attends Religious Services: Not on file  . Active Member of Clubs or Organizations: Not on file  . Attends Archivist Meetings: Not on file  . Marital Status: Not on file  Intimate Partner Violence:   . Fear of Current or Ex-Partner: Not on file  . Emotionally Abused: Not on file  . Physically Abused: Not on file  . Sexually Abused: Not on file   Family Status  Relation Name Status  . Father  Alive  . Mother  Alive  . Sister  Alive  . Sister  Alive  . Sister  Alive  . Mat Aunt  (Not Specified)  . Mat Aunt  (Not Specified)   Family History  Problem Relation Age of Onset  . Cancer Father        lung  . Heart disease Father   . Diabetes Mother   . Healthy Sister   . Healthy Sister   . Healthy Sister   . Breast cancer Maternal Aunt   . Breast cancer Maternal Aunt    Allergies  Allergen Reactions  . Sulfa Antibiotics Rash    Patient Care Team: Mar Daring, PA-C as PCP - General (Family Medicine)   Medications: Outpatient Medications Prior to Visit  Medication Sig  . amoxicillin (AMOXIL) 500 MG capsule Take 2013m (4 capsules) prior to dental procedures or cleanings.  . Blood Glucose Monitoring Suppl (ONETOUCH ULTRALINK) w/Device KIT To check blood sugar once daily.  .  cholecalciferol (VITAMIN D) 1000 UNITS tablet Take 1,000 Units by mouth daily.  . famotidine (PEPCID) 10 MG tablet Take 10 mg by mouth daily.  . Lancets (ONETOUCH DELICA PLUS [id number] MISC USE LANCET TO CHECK BLOOD SUGAR ONCE DAILY  . loratadine-pseudoephedrine (CLARITIN-D 12 HOUR) 5-120 MG tablet CLARITIN-D 12 HOUR, 5-120MG (Oral Tablet Extended Release 12 Hour)  1 Every Day for 0 days  Quantity: 0.00;  Refills: 0   Ordered :21-Aug-2010  WAshley Royalty;  Started 13-Aug-2009 Active  . metFORMIN (GLUCOPHAGE) 1000 MG tablet Take 1 tablet (1,000 mg total) by mouth 2 (two) times daily with a meal.  . Misc Natural Products (OSTEO BI-FLEX ADV TRIPLE ST) TABS Take 1 tablet by mouth daily.  . MULTIPLE VITAMINS-MINERALS PO Take 1 tablet by mouth daily.   .Glory RosebushVERIO test strip USE STRIP TO CHECK BLOOD SUGAR ONCE DAILY  . simvastatin (ZOCOR) 20 MG tablet Take 1 tablet (20 mg total) by mouth at bedtime.   No facility-administered medications prior to visit.    Review of Systems  Constitutional: Negative.   HENT: Negative.   Eyes: Negative.   Respiratory: Negative.   Cardiovascular: Negative.   Gastrointestinal: Negative.   Endocrine: Negative.   Genitourinary: Negative.   Musculoskeletal: Negative.   Skin: Negative.   Allergic/Immunologic: Negative.   Neurological: Negative.   Hematological: Negative.   Psychiatric/Behavioral: Negative.     Last CBC Lab Results  Component Value Date   WBC 8.6 11/24/2018   HGB 12.8 11/24/2018   HCT 38.9 11/24/2018   MCV 88 11/24/2018   MCH 28.9 11/24/2018   RDW 12.6 11/24/2018   PLT 405 118/34/3735  Last metabolic panel Lab Results  Component Value Date   GLUCOSE 103 (H) 11/24/2018   NA 137 11/24/2018   K 4.9 11/24/2018   CL 98 11/24/2018   CO2 24 11/24/2018   BUN 13 11/24/2018   CREATININE 0.75 11/24/2018   GFRNONAA 87 11/24/2018   GFRAA 100 11/24/2018   CALCIUM 10.5 (H) 11/24/2018   PROT 7.6 11/24/2018   ALBUMIN 4.5 11/24/2018     LABGLOB 3.1 11/24/2018   AGRATIO 1.5 11/24/2018   BILITOT 0.3 11/24/2018   ALKPHOS 80 11/24/2018   AST 27 11/24/2018   ALT 23 11/24/2018      Objective    BP 137/77 (BP Location: Left Arm, Patient Position: Sitting, Cuff Size: Large)   Pulse 72   Resp 16   Wt 203 lb (92.1 kg)   SpO2 100%   BMI 32.77 kg/m  BP Readings from Last 3 Encounters:  11/30/19 137/77  11/24/18 128/82  05/21/18 135/87   Wt Readings from Last 3 Encounters:  11/30/19 203 lb (92.1 kg)  11/24/18 211 lb 6.4 oz (95.9 kg)  05/21/18 213 lb 6.4 oz (96.8 kg)      Physical Exam Vitals reviewed.  Constitutional:      General: She is not in acute distress.    Appearance: Normal appearance. She is well-developed. She is obese. She is not ill-appearing or diaphoretic.  HENT:     Head: Normocephalic and atraumatic.     Right Ear: Tympanic membrane, ear canal and external ear normal.     Left Ear: Tympanic membrane, ear canal and external ear normal.     Nose: Nose normal.     Mouth/Throat:     Mouth: Mucous membranes are moist.     Pharynx: Oropharynx is clear. No oropharyngeal exudate.  Eyes:     General: No scleral icterus.       Right eye: No discharge.        Left eye: No discharge.     Extraocular Movements: Extraocular movements intact.     Conjunctiva/sclera: Conjunctivae normal.     Pupils: Pupils are equal, round, and reactive to light.  Neck:     Thyroid: No thyromegaly.     Vascular: No carotid bruit or JVD.     Trachea: No tracheal deviation.  Cardiovascular:     Rate and Rhythm: Normal rate and regular rhythm.     Pulses: Normal pulses.     Heart sounds: Murmur heard.  Systolic murmur is present with a grade of 2/6.  No friction rub. No gallop.   Pulmonary:     Effort: Pulmonary effort is normal. No respiratory distress.     Breath sounds: Normal breath sounds. No wheezing or rales.  Chest:     Chest wall: No tenderness.  Abdominal:     General: Abdomen is flat. Bowel sounds are  normal. There is no distension.     Palpations: Abdomen is soft. There is no mass.     Tenderness: There is no abdominal tenderness. There is no guarding or rebound.  Musculoskeletal:        General: No tenderness. Normal range of motion.     Cervical back: Normal range of motion and neck supple. No tenderness.     Right lower leg: No edema.     Left lower leg: No edema.  Lymphadenopathy:     Cervical: No cervical adenopathy.  Skin:    General: Skin is warm and dry.     Capillary Refill: Capillary refill takes less than 2 seconds.     Findings: No rash.  Neurological:     General: No focal deficit present.     Mental Status: She is alert and oriented to person, place, and time. Mental status is at baseline.  Psychiatric:        Mood and Affect: Mood normal.        Behavior: Behavior normal.        Thought Content: Thought content normal.        Judgment: Judgment normal.     Last depression screening scores PHQ 2/9 Scores 11/30/2019 11/24/2018 11/20/2017  PHQ - 2 Score 0 0 0  PHQ- 9 Score 2 - -   Last fall risk screening Fall Risk  11/24/2018  Falls in the past year? 0  Number falls in past yr: 0  Injury with Fall? 0  Follow up Falls evaluation completed   Last Audit-C alcohol  use screening Alcohol Use Disorder Test (AUDIT) 11/30/2019  1. How often do you have a drink containing alcohol? 0  2. How many drinks containing alcohol do you have on a typical day when you are drinking? 0  3. How often do you have six or more drinks on one occasion? 0  AUDIT-C Score 0  Alcohol Brief Interventions/Follow-up AUDIT Score <7 follow-up not indicated   A score of 3 or more in women, and 4 or more in men indicates increased risk for alcohol abuse, EXCEPT if all of the points are from question 1   No results found for any visits on 11/30/19.  Assessment & Plan    Routine Health Maintenance and Physical Exam  Exercise Activities and Dietary recommendations Goals    . Exercise 150  minutes per week (moderate activity)       Immunization History  Administered Date(s) Administered  . Td 11/20/2017  . Tdap 07/14/2007    Health Maintenance  Topic Date Due  . PNEUMOCOCCAL POLYSACCHARIDE VACCINE AGE 30-64 HIGH RISK  Never done  . COVID-19 Vaccine (1) Never done  . HEMOGLOBIN A1C  05/24/2019  . FOOT EXAM  11/24/2019  . URINE MICROALBUMIN  11/24/2019  . OPHTHALMOLOGY EXAM  10/19/2020  . MAMMOGRAM  11/17/2020  . PAP SMEAR-Modifier  11/23/2021  . Fecal DNA (Cologuard)  12/05/2021  . TETANUS/TDAP  11/21/2027  . Hepatitis C Screening  Completed  . HIV Screening  Discontinued    Discussed health benefits of physical activity, and encouraged her to engage in regular exercise appropriate for her age and condition.  1. Annual physical exam Normal physical exam today. Will check labs as below and f/u pending lab results. If labs are stable and WNL she will not need to have these rechecked for one year at her next annual physical exam. She is to call the office in the meantime if she has any acute issue, questions or concerns.  2. Encounter for breast cancer screening using non-mammogram modality Does self breast exams. Mammogram already scheduled for 12/12/19  3. Diabetes mellitus without complication (HCC) Stable. Continue Metformin 1061m BID. On Statin. Will check labs as below and f/u pending results. - CBC w/Diff/Platelet - Comprehensive Metabolic Panel (CMET) - TSH - Lipid Panel With LDL/HDL Ratio - HgB A1c  4. Hypercholesteremia Stable on Simvastatin 234m Will check labs as below and f/u pending results. - CBC w/Diff/Platelet - Comprehensive Metabolic Panel (CMET) - TSH - Lipid Panel With LDL/HDL Ratio - HgB A1c  5. Avitaminosis D H/O this and postmenopausal. Will check labs as below and f/u pending results. - CBC w/Diff/Platelet - Vitamin D (25 hydroxy)   No follow-ups on file.     I,Reynolds BowlPA-C, have reviewed all documentation  for this visit. The documentation on 11/30/19 for the exam, diagnosis, procedures, and orders are all accurate and complete.   JeRubye BeachBuCobblestone Surgery [phone]) [phone])  [place]

## 2019-11-30 ENCOUNTER — Other Ambulatory Visit: Payer: Self-pay

## 2019-11-30 ENCOUNTER — Encounter: Payer: Self-pay | Admitting: Physician Assistant

## 2019-11-30 ENCOUNTER — Ambulatory Visit (INDEPENDENT_AMBULATORY_CARE_PROVIDER_SITE_OTHER): Payer: BC Managed Care – PPO | Admitting: Physician Assistant

## 2019-11-30 VITALS — BP 137/77 | HR 72 | Resp 16 | Wt 203.0 lb

## 2019-11-30 DIAGNOSIS — Z0000 Encounter for general adult medical examination without abnormal findings: Secondary | ICD-10-CM | POA: Diagnosis not present

## 2019-11-30 DIAGNOSIS — Z1239 Encounter for other screening for malignant neoplasm of breast: Secondary | ICD-10-CM

## 2019-11-30 DIAGNOSIS — E119 Type 2 diabetes mellitus without complications: Secondary | ICD-10-CM | POA: Diagnosis not present

## 2019-11-30 DIAGNOSIS — E7800 Pure hypercholesterolemia, unspecified: Secondary | ICD-10-CM

## 2019-11-30 DIAGNOSIS — E559 Vitamin D deficiency, unspecified: Secondary | ICD-10-CM

## 2019-11-30 NOTE — Patient Instructions (Signed)

## 2019-12-01 LAB — COMPREHENSIVE METABOLIC PANEL
ALT: 16 IU/L (ref 0–32)
AST: 22 IU/L (ref 0–40)
Albumin/Globulin Ratio: 1.4 (ref 1.2–2.2)
Albumin: 4.5 g/dL (ref 3.8–4.8)
Alkaline Phosphatase: 87 IU/L (ref 44–121)
BUN/Creatinine Ratio: 16 (ref 12–28)
BUN: 12 mg/dL (ref 8–27)
Bilirubin Total: 0.4 mg/dL (ref 0.0–1.2)
CO2: 22 mmol/L (ref 20–29)
Calcium: 10.9 mg/dL — ABNORMAL HIGH (ref 8.7–10.3)
Chloride: 97 mmol/L (ref 96–106)
Creatinine, Ser: 0.77 mg/dL (ref 0.57–1.00)
GFR calc Af Amer: 96 mL/min/{1.73_m2} (ref >59)
GFR calc non Af Amer: 84 mL/min/{1.73_m2} (ref >59)
Globulin, Total: 3.2 g/dL (ref 1.5–4.5)
Glucose: 106 mg/dL — ABNORMAL HIGH (ref 65–99)
Potassium: 4.8 mmol/L (ref 3.5–5.2)
Sodium: 136 mmol/L (ref 134–144)
Total Protein: 7.7 g/dL (ref 6.0–8.5)

## 2019-12-01 LAB — TSH: TSH: 1.77 u[IU]/mL (ref 0.450–4.500)

## 2019-12-01 LAB — LIPID PANEL WITH LDL/HDL RATIO
Cholesterol, Total: 185 mg/dL (ref 100–199)
HDL: 56 mg/dL (ref >39)
LDL Chol Calc (NIH): 92 mg/dL (ref 0–99)
LDL/HDL Ratio: 1.6 ratio (ref 0.0–3.2)
Triglycerides: 223 mg/dL — ABNORMAL HIGH (ref 0–149)
VLDL Cholesterol Cal: 37 mg/dL (ref 5–40)

## 2019-12-01 LAB — CBC WITH DIFFERENTIAL/PLATELET
Basophils Absolute: 0.1 10*3/uL (ref 0.0–0.2)
Basos: 1 %
EOS (ABSOLUTE): 0.2 10*3/uL (ref 0.0–0.4)
Eos: 3 %
Hematocrit: 37.3 % (ref 34.0–46.6)
Hemoglobin: 12.4 g/dL (ref 11.1–15.9)
Immature Grans (Abs): 0 10*3/uL (ref 0.0–0.1)
Immature Granulocytes: 0 %
Lymphocytes Absolute: 2.8 10*3/uL (ref 0.7–3.1)
Lymphs: 40 %
MCH: 27.5 pg (ref 26.6–33.0)
MCHC: 33.2 g/dL (ref 31.5–35.7)
MCV: 83 fL (ref 79–97)
Monocytes Absolute: 0.4 10*3/uL (ref 0.1–0.9)
Monocytes: 6 %
Neutrophils Absolute: 3.5 10*3/uL (ref 1.4–7.0)
Neutrophils: 50 %
Platelets: 376 10*3/uL (ref 150–450)
RBC: 4.51 x10E6/uL (ref 3.77–5.28)
RDW: 15.1 % (ref 11.7–15.4)
WBC: 7.1 10*3/uL (ref 3.4–10.8)

## 2019-12-01 LAB — VITAMIN D 25 HYDROXY (VIT D DEFICIENCY, FRACTURES): Vit D, 25-Hydroxy: 41.4 ng/mL (ref 30.0–100.0)

## 2019-12-01 LAB — HEMOGLOBIN A1C
Est. average glucose Bld gHb Est-mCnc: 151 mg/dL
Hgb A1c MFr Bld: 6.9 % — ABNORMAL HIGH (ref 4.8–5.6)

## 2019-12-12 ENCOUNTER — Other Ambulatory Visit: Payer: Self-pay

## 2019-12-12 ENCOUNTER — Ambulatory Visit
Admission: RE | Admit: 2019-12-12 | Discharge: 2019-12-12 | Disposition: A | Discharge status: Home / Self Care | Payer: BC Managed Care – PPO | Source: Ambulatory Visit | Attending: Physician Assistant | Admitting: Physician Assistant

## 2019-12-12 DIAGNOSIS — Z1231 Encounter for screening mammogram for malignant neoplasm of breast: Secondary | ICD-10-CM | POA: Insufficient documentation

## 2020-04-06 ENCOUNTER — Telehealth: Payer: Self-pay

## 2020-04-06 ENCOUNTER — Encounter: Payer: Self-pay | Admitting: Physician Assistant

## 2020-04-06 DIAGNOSIS — E7800 Pure hypercholesterolemia, unspecified: Secondary | ICD-10-CM

## 2020-04-06 DIAGNOSIS — Z96641 Presence of right artificial hip joint: Secondary | ICD-10-CM

## 2020-04-06 DIAGNOSIS — E119 Type 2 diabetes mellitus without complications: Secondary | ICD-10-CM

## 2020-04-06 NOTE — Telephone Encounter (Signed)
Patient send the following message via MyChart: need refills for the following: Metformin 1000MG  Tab QTY 180 with 3 refills Simvastatin 20MG  Tab  QTY 90 with 3 refills Amoxicillim 500MG  Cap QTY 28  take 4 caps 1 hour before dental Procedure or cleanings Onetouch Verio test strips Qty 100 with 3 refills Onetouch Delica Lan 33G Qty 100 with 3 refills  please send to in [place].  Thank you   It's been a pleasure being your patient.  You will be missed.  Good luck in your next endeavors.  Tvisha Faucett L.O.V. was on 11/30/2019.

## 2020-04-06 NOTE — Telephone Encounter (Signed)
Message converted into a telephone message.

## 2020-04-09 MED ORDER — AMOXICILLIN 500 MG PO CAPS: ORAL_CAPSULE | ORAL | 0 refills | Status: DC

## 2020-04-09 MED ORDER — SIMVASTATIN 20 MG PO TABS: 20.0000 mg | ORAL_TABLET | Freq: Every day | ORAL | 3 refills | Status: DC

## 2020-04-09 MED ORDER — ONETOUCH DELICA PLUS LANCET33G MISC: 2 refills | Status: AC

## 2020-04-09 MED ORDER — METFORMIN HCL 1000 MG PO TABS: 1000.0000 mg | ORAL_TABLET | Freq: Two times a day (BID) | ORAL | 3 refills | Status: DC

## 2020-04-09 MED ORDER — ONETOUCH VERIO VI STRP: ORAL_STRIP | 2 refills | Status: DC

## 2020-05-30 ENCOUNTER — Ambulatory Visit: Payer: Self-pay | Admitting: Physician Assistant

## 2020-05-31 ENCOUNTER — Encounter: Payer: Self-pay | Admitting: Family Medicine

## 2020-05-31 ENCOUNTER — Ambulatory Visit (INDEPENDENT_AMBULATORY_CARE_PROVIDER_SITE_OTHER): Payer: BC Managed Care – PPO | Admitting: Family Medicine

## 2020-05-31 ENCOUNTER — Other Ambulatory Visit: Payer: Self-pay

## 2020-05-31 VITALS — BP 116/64 | HR 74 | Temp 97.7°F | Resp 16 | Ht 66.0 in | Wt 207.0 lb

## 2020-05-31 DIAGNOSIS — E119 Type 2 diabetes mellitus without complications: Secondary | ICD-10-CM

## 2020-05-31 DIAGNOSIS — Z6832 Body mass index (BMI) 32.0-32.9, adult: Secondary | ICD-10-CM

## 2020-05-31 DIAGNOSIS — E669 Obesity, unspecified: Secondary | ICD-10-CM | POA: Diagnosis not present

## 2020-05-31 DIAGNOSIS — E1169 Type 2 diabetes mellitus with other specified complication: Secondary | ICD-10-CM | POA: Diagnosis not present

## 2020-05-31 DIAGNOSIS — E785 Hyperlipidemia, unspecified: Secondary | ICD-10-CM | POA: Diagnosis not present

## 2020-05-31 DIAGNOSIS — E782 Mixed hyperlipidemia: Secondary | ICD-10-CM | POA: Insufficient documentation

## 2020-05-31 LAB — POCT GLYCOSYLATED HEMOGLOBIN (HGB A1C)
Est. average glucose Bld gHb Est-mCnc: 134
Hemoglobin A1C: 6.3 % — AB (ref 4.0–5.6)

## 2020-05-31 LAB — POCT UA - MICROALBUMIN: Microalbumin Ur, POC: 20 mg/L

## 2020-05-31 MED ORDER — ONETOUCH VERIO VI STRP: ORAL_STRIP | 2 refills | Status: DC

## 2020-05-31 NOTE — Assessment & Plan Note (Signed)
Well controlled with A1c 6.3 Continue current medications UTD on eye exam foot exam completed today Consider Pneumococcal vaccine Urine microalbumin On Statin Discussed diet and exercise F/u in 6 months

## 2020-05-31 NOTE — Patient Instructions (Addendum)
Consider pneumonia vaccine  Diabetes Mellitus and Nutrition, Adult When you have diabetes, or diabetes mellitus, it is very important to have healthy eating habits because your blood sugar (glucose) levels are greatly affected by what you eat and drink. Eating healthy foods in the right amounts, at about the same times every day, can help you:  Control your blood glucose.  Lower your risk of heart disease.  Improve your blood pressure.  Reach or maintain a healthy weight. What can affect my meal plan? Every person with diabetes is different, and each person has different needs for a meal plan. Your health care provider may recommend that you work with a dietitian to make a meal plan that is best for you. Your meal plan may vary depending on factors such as:  The calories you need.  The medicines you take.  Your weight.  Your blood glucose, blood pressure, and cholesterol levels.  Your activity level.  Other health conditions you have, such as heart or kidney disease. How do carbohydrates affect me? Carbohydrates, also called carbs, affect your blood glucose level more than any other type of food. Eating carbs naturally raises the amount of glucose in your blood. Carb counting is a method for keeping track of how many carbs you eat. Counting carbs is important to keep your blood glucose at a healthy level, especially if you use insulin or take certain oral diabetes medicines. It is important to know how many carbs you can safely have in each meal. This is different for every person. Your dietitian can help you calculate how many carbs you should have at each meal and for each snack. How does alcohol affect me? Alcohol can cause a sudden decrease in blood glucose (hypoglycemia), especially if you use insulin or take certain oral diabetes medicines. Hypoglycemia can be a life-threatening condition. Symptoms of hypoglycemia, such as sleepiness, dizziness, and confusion, are similar to  symptoms of having too much alcohol.  Do not drink alcohol if: ? Your health care provider tells you not to drink. ? You are pregnant, may be pregnant, or are planning to become pregnant.  If you drink alcohol: ? Do not drink on an empty stomach. ? Limit how much you use to:  0-1 drink a day for women.  0-2 drinks a day for men. ? Be aware of how much alcohol is in your drink. In the [place] one drink equals one 12 oz bottle of beer (355 mL), one 5 oz glass of wine (148 mL), or one 1 oz glass of hard liquor (44 mL). ? Keep yourself hydrated with water, diet soda, or unsweetened iced tea.  Keep in mind that regular soda, juice, and other mixers may contain a lot of sugar and must be counted as carbs. What are tips for following this plan? Reading food labels  Start by checking the serving size on the "Nutrition Facts" label of packaged foods and drinks. The amount of calories, carbs, fats, and other nutrients listed on the label is based on one serving of the item. Many items contain more than one serving per package.  Check the total grams (g) of carbs in one serving. You can calculate the number of servings of carbs in one serving by dividing the total carbs by 15. For example, if a food has 30 g of total carbs per serving, it would be equal to 2 servings of carbs.  Check the number of grams (g) of saturated fats and trans fats in one serving.  Choose foods that have a low amount or none of these fats.  Check the number of milligrams (mg) of salt (sodium) in one serving. Most people should limit total sodium intake to less than 2,300 mg per day.  Always check the nutrition information of foods labeled as "low-fat" or "nonfat." These foods may be higher in added sugar or refined carbs and should be avoided.  Talk to your dietitian to identify your daily goals for nutrients listed on the label. Shopping  Avoid buying canned, pre-made, or processed foods. These foods tend to be high in  fat, sodium, and added sugar.  Shop around the outside edge of the grocery store. This is where you will most often find fresh fruits and vegetables, bulk grains, fresh meats, and fresh dairy. Cooking  Use low-heat cooking methods, such as baking, instead of high-heat cooking methods like deep frying.  Cook using healthy oils, such as olive, canola, or sunflower oil.  Avoid cooking with butter, cream, or high-fat meats. Meal planning  Eat meals and snacks regularly, preferably at the same times every day. Avoid going long periods of time without eating.  Eat foods that are high in fiber, such as fresh fruits, vegetables, beans, and whole grains. Talk with your dietitian about how many servings of carbs you can eat at each meal.  Eat 4-6 oz (112-168 g) of lean protein each day, such as lean meat, chicken, fish, eggs, or tofu. One ounce (oz) of lean protein is equal to: ? 1 oz (28 g) of meat, chicken, or fish. ? 1 egg. ?  cup (62 g) of tofu.  Eat some foods each day that contain healthy fats, such as avocado, nuts, seeds, and fish.   What foods should I eat? Fruits Berries. Apples. Oranges. Peaches. Apricots. Plums. Grapes. Mango. Papaya. Pomegranate. Kiwi. Cherries. Vegetables Lettuce. Spinach. Leafy greens, including kale, chard, collard greens, and mustard greens. Beets. Cauliflower. Cabbage. Broccoli. Carrots. Green beans. Tomatoes. Peppers. Onions. Cucumbers. Brussels sprouts. Grains Whole grains, such as whole-wheat or whole-grain bread, crackers, tortillas, cereal, and pasta. Unsweetened oatmeal. Quinoa. Brown or wild rice. Meats and other proteins Seafood. Poultry without skin. Lean cuts of poultry and beef. Tofu. Nuts. Seeds. Dairy Low-fat or fat-free dairy products such as milk, yogurt, and cheese. The items listed above may not be a complete list of foods and beverages you can eat. Contact a dietitian for more information. What foods should I avoid? Fruits Fruits canned  with syrup. Vegetables Canned vegetables. Frozen vegetables with butter or cream sauce. Grains Refined white flour and flour products such as bread, pasta, snack foods, and cereals. Avoid all processed foods. Meats and other proteins Fatty cuts of meat. Poultry with skin. Breaded or fried meats. Processed meat. Avoid saturated fats. Dairy Full-fat yogurt, cheese, or milk. Beverages Sweetened drinks, such as soda or iced tea. The items listed above may not be a complete list of foods and beverages you should avoid. Contact a dietitian for more information. Questions to ask a health care provider  Do I need to meet with a diabetes educator?  Do I need to meet with a dietitian?  What number can I call if I have questions?  When are the best times to check my blood glucose? Where to find more information:  American Diabetes Association: diabetes.org  Academy of Nutrition and Dietetics: www.eatright.[employer] of Diabetes and Digestive and Kidney Diseases: CarFlippers.tn  Association of Diabetes Care and Education Specialists: www.diabeteseducator.org Summary  It is important  to have healthy eating habits because your blood sugar (glucose) levels are greatly affected by what you eat and drink.  A healthy meal plan will help you control your blood glucose and maintain a healthy lifestyle.  Your health care provider may recommend that you work with a dietitian to make a meal plan that is best for you.  Keep in mind that carbohydrates (carbs) and alcohol have immediate effects on your blood glucose levels. It is important to count carbs and to use alcohol carefully. This information is not intended to replace advice given to you by your health care provider. Make sure you discuss any questions you have with your health care provider. Document Revised: 11/30/2018 Document Reviewed: 11/30/2018 Elsevier Patient Education  2021 [employer].

## 2020-05-31 NOTE — Progress Notes (Signed)
Established patient visit   Patient: Madison Mason   DOB: October 23, 1955   65 y.o. Female  MRN: [id number] Visit Date: 05/31/2020  Today's healthcare provider: Lavon Paganini, MD   Chief Complaint  Patient presents with  . Diabetes  . Hyperlipidemia    Subjective    Diabetes Pertinent negatives for hypoglycemia include no dizziness, headaches or seizures. Pertinent negatives for diabetes include no chest pain, no fatigue and no weakness.  Hyperlipidemia Pertinent negatives include no chest pain or shortness of breath.    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 She doesn't need to to take the amoxicillin everyday.   Eye Exam She wears contacts and she has already had her eye exam.   Vaccine She has not received her pneumonia vaccine. She is requesting to add a note to receive it for her physical exam appointment.   Test Strips She is requesting for a refill of test strips noted for twice a day. She says that insurance coverage wasn't an issue when she received her hip replacements. So this year she says insurance wont cover 100 test strips they only cover 50.   Diabetes Mellitus Type II, follow-up  Lab Results  Component Value Date   HGBA1C 6.3 (A) 05/31/2020   HGBA1C 6.9 (H) 11/30/2019   HGBA1C 6.6 (H) 11/24/2018   Last seen for diabetes 6 months ago.  Management since then includes continuing the same treatment. She reports excellent compliance with treatment. She is not having side effects. She is tolerating 1016m of Metformin.  Home blood sugar records: fasting range: 90's-105  Episodes of hypoglycemia? No   Current insulin regiment: none Most Recent Eye Exam: UTD  ---------------------------------------------------------------------------------------------------  Lipid/Cholesterol, follow-up  Last Lipid Panel: Lab Results  Component Value Date   CHOL 185 11/30/2019   LDLCALC 92  11/30/2019   HDL 56 11/30/2019   TRIG 223 (H) 11/30/2019    She was last seen for this 6 months ago.  Management since that visit includes no changes.  She reports excellent compliance with treatment. She is not having side effects.  Symptoms: No appetite changes No foot ulcerations  No chest pain No chest pressure/discomfort  No dyspnea No orthopnea  No fatigue No lower extremity edema  No palpitations No paroxysmal nocturnal dyspnea  No nausea No numbness or tingling of extremity  No polydipsia No polyuria  No speech difficulty No syncope   She is following a Regular diet. Current exercise: walking  Last metabolic panel Lab Results  Component Value Date   GLUCOSE 106 (H) 11/30/2019   NA 136 11/30/2019   K 4.8 11/30/2019   BUN 12 11/30/2019   CREATININE 0.77 11/30/2019   GFRNONAA 84 11/30/2019   GFRAA 96 11/30/2019   CALCIUM 10.9 (H) 11/30/2019   AST 22 11/30/2019   ALT 16 11/30/2019   The 10-year ASCVD risk score (Mikey BussingDC Jr., et al., 2013) is: 6%  ---------------------------------------------------------------------------------------------------   Patient Active Problem List   Diagnosis Date Noted  . Class 1 obesity with serious comorbidity and body mass index (BMI) of 32.0 to 32.9 in adult 05/31/2020  . Hyperlipidemia associated with type 2 diabetes mellitus [place] 05/31/2020  . Cannot sleep 10/31/2014  . Avitaminosis D 10/31/2014  . T[place] (type 2 diabetes mellitus) [place] 10/31/2014  . Allergic rhinitis 03/24/2006   Social History   Tobacco Use  . Smoking status: Former Smoker    Packs/day: 1.00  Years: 25.00    Pack years: 25.00    Types: Cigarettes    Quit date: 01/05/1997    Years since quitting: 23.4  . Smokeless tobacco: Never Used  . Tobacco comment: Quit in 1988  Substance Use Topics  . Alcohol use: Yes    Comment: occasionally  . Drug use: No   Allergies  Allergen Reactions  . Sulfa Antibiotics Rash       Medications: Outpatient  Medications Prior to Visit  Medication Sig  . amoxicillin (AMOXIL) 500 MG capsule Take 2010m (4 capsules) prior to dental procedures or cleanings.  . Blood Glucose Monitoring Suppl (ONETOUCH ULTRALINK) w/Device KIT To check blood sugar once daily.  . cholecalciferol (VITAMIN D) 1000 UNITS tablet Take 1,000 Units by mouth daily.  . famotidine (PEPCID) 10 MG tablet Take 10 mg by mouth daily.  . Lancets (ONETOUCH DELICA PLUS [id number] MISC USE LANCET TO CHECK BLOOD SUGAR ONCE DAILY  . loratadine-pseudoephedrine (CLARITIN-D 12-HOUR) 5-120 MG tablet CLARITIN-D 12 HOUR, 5-120MG (Oral Tablet Extended Release 12 Hour)  1 Every Day for 0 days  Quantity: 0.00;  Refills: 0   Ordered :21-Aug-2010  WAshley Royalty;  Started 13-Aug-2009 Active  . metFORMIN (GLUCOPHAGE) 1000 MG tablet Take 1 tablet (1,000 mg total) by mouth 2 (two) times daily with a meal.  . Misc Natural Products (OSTEO BI-FLEX ADV TRIPLE ST) TABS Take 1 tablet by mouth daily.  . MULTIPLE VITAMINS-MINERALS PO Take 1 tablet by mouth daily.   . simvastatin (ZOCOR) 20 MG tablet Take 1 tablet (20 mg total) by mouth at bedtime.  . [DISCONTINUED] glucose blood (ONETOUCH VERIO) test strip USE STRIP TO CHECK BLOOD SUGAR ONCE DAILY   No facility-administered medications prior to visit.    Review of Systems  Constitutional: Negative for appetite change, chills, fatigue and fever.  HENT: Negative for ear pain, nosebleeds, sinus pressure, sinus pain and sore throat.   Eyes: Negative for pain and visual disturbance.  Respiratory: Negative for cough, chest tightness, shortness of breath and wheezing.   Cardiovascular: Negative for chest pain, palpitations and leg swelling.  Gastrointestinal: Negative for abdominal pain, blood in stool, diarrhea, nausea and vomiting.  Genitourinary: Negative for dysuria, flank pain, frequency, pelvic pain and urgency.  Musculoskeletal: Negative for back pain, neck pain and neck stiffness.  Neurological: Negative  for dizziness, seizures, syncope, weakness, light-headedness, numbness and headaches.       Objective    BP 116/64 (BP Location: Right Arm, Patient Position: Sitting, Cuff Size: Large)   Pulse 74   Temp 97.7 F (36.5 C) (Oral)   Resp 16   Ht '5\' 6"'  (1.676 m)   Wt 207 lb (93.9 kg)   SpO2 97%   BMI 33.41 kg/m  BP Readings from Last 3 Encounters:  05/31/20 116/64  11/30/19 137/77  11/24/18 128/82   Wt Readings from Last 3 Encounters:  05/31/20 207 lb (93.9 kg)  11/30/19 203 lb (92.1 kg)  11/24/18 211 lb 6.4 oz (95.9 kg)       Physical Exam Vitals reviewed.  Constitutional:      General: She is not in acute distress.    Appearance: Normal appearance. She is well-developed. She is not diaphoretic.  HENT:     Head: Normocephalic and atraumatic.  Eyes:     General: No scleral icterus.    Conjunctiva/sclera: Conjunctivae normal.  Neck:     Thyroid: No thyromegaly.  Cardiovascular:     Rate and Rhythm: Normal rate and regular rhythm.  Pulses: Normal pulses.     Heart sounds: Normal heart sounds. No murmur heard.   Pulmonary:     Effort: Pulmonary effort is normal. No respiratory distress.     Breath sounds: Normal breath sounds. No wheezing, rhonchi or rales.  Musculoskeletal:     Cervical back: Neck supple.     Right lower leg: No edema.     Left lower leg: No edema.  Lymphadenopathy:     Cervical: No cervical adenopathy.  Skin:    General: Skin is warm and dry.     Findings: No rash.  Neurological:     Mental Status: She is alert and oriented to person, place, and time. Mental status is at baseline.  Psychiatric:        Mood and Affect: Mood normal.        Behavior: Behavior normal.       Results for orders placed or performed in visit on 05/31/20  POCT glycosylated hemoglobin (Hb A1C)  Result Value Ref Range   Hemoglobin A1C 6.3 (A) 4.0 - 5.6 %   Est. average glucose Bld gHb Est-mCnc 134   POCT UA - Microalbumin  Result Value Ref Range    Microalbumin Ur, POC 20 mg/L    Assessment & Plan     Problem List Items Addressed This Visit      Endocrine   T2DM (type 2 diabetes mellitus) ([place]) - Primary    Well controlled with A1c 6.3 Continue current medications UTD on eye exam foot exam completed today Consider Pneumococcal vaccine Urine microalbumin On Statin Discussed diet and exercise F/u in 6 months       Relevant Medications   glucose blood (ONETOUCH VERIO) test strip   Other Relevant Orders   POCT glycosylated hemoglobin (Hb A1C) (Completed)   POCT UA - Microalbumin (Completed)   Hyperlipidemia associated with type 2 diabetes mellitus (HCC)    Previously above goal Continue statin - consider higher intensity statin Repeat FLP and CMP Goal LDL < 70       Relevant Orders   Comprehensive metabolic panel   Lipid panel     Other   Class 1 obesity with serious comorbidity and body mass index (BMI) of 32.0 to 32.9 in adult    Discussed importance of healthy weight management Discussed diet and exercise           Return in about 6 months (around 12/01/2020) for CPE, call back in 1 month to schedule with new PCP.       I,Essence Turner,acting as a Education administrator for Lavon Paganini, MD.,have documented all relevant documentation on the behalf of Lavon Paganini, MD,as directed by  Lavon Paganini, MD while in the presence of Lavon Paganini, MD.  I, Lavon Paganini, MD, have reviewed all documentation for this visit. The documentation on 05/31/20 for the exam, diagnosis, procedures, and orders are all accurate and complete.   Landrie Beale, Dionne Bucy, MD, MPH [facility]

## 2020-05-31 NOTE — Assessment & Plan Note (Signed)
Previously above goal Continue statin - consider higher intensity statin Repeat FLP and CMP Goal LDL < 70

## 2020-05-31 NOTE — Assessment & Plan Note (Signed)
Discussed importance of healthy weight management Discussed diet and exercise  

## 2020-06-01 ENCOUNTER — Telehealth: Payer: Self-pay

## 2020-06-01 LAB — COMPREHENSIVE METABOLIC PANEL
ALT: 18 IU/L (ref 0–32)
AST: 16 IU/L (ref 0–40)
Albumin/Globulin Ratio: 1.8 (ref 1.2–2.2)
Albumin: 4.9 g/dL — ABNORMAL HIGH (ref 3.8–4.8)
Alkaline Phosphatase: 79 IU/L (ref 44–121)
BUN/Creatinine Ratio: 12 (ref 12–28)
BUN: 10 mg/dL (ref 8–27)
Bilirubin Total: 0.3 mg/dL (ref 0.0–1.2)
CO2: 25 mmol/L (ref 20–29)
Calcium: 10.2 mg/dL (ref 8.7–10.3)
Chloride: 99 mmol/L (ref 96–106)
Creatinine, Ser: 0.81 mg/dL (ref 0.57–1.00)
Globulin, Total: 2.8 g/dL (ref 1.5–4.5)
Glucose: 94 mg/dL (ref 65–99)
Potassium: 4.8 mmol/L (ref 3.5–5.2)
Sodium: 136 mmol/L (ref 134–144)
Total Protein: 7.7 g/dL (ref 6.0–8.5)
eGFR: 82 mL/min/{1.73_m2} (ref >59)

## 2020-06-01 LAB — LIPID PANEL
Chol/HDL Ratio: 4.3 ratio (ref 0.0–4.4)
Cholesterol, Total: 218 mg/dL — ABNORMAL HIGH (ref 100–199)
HDL: 51 mg/dL (ref >39)
LDL Chol Calc (NIH): 99 mg/dL (ref 0–99)
Triglycerides: 408 mg/dL — ABNORMAL HIGH (ref 0–149)
VLDL Cholesterol Cal: 68 mg/dL — ABNORMAL HIGH (ref 5–40)

## 2020-06-01 MED ORDER — ROSUVASTATIN CALCIUM 5 MG PO TABS: 5.0000 mg | ORAL_TABLET | Freq: Every day | ORAL | 3 refills | Status: DC

## 2020-06-01 NOTE — Telephone Encounter (Signed)
-----   Message from Erasmo Downer, MD sent at 06/01/2020  8:07 AM EDT ----- Normal labs, except for cholesterol remains above goal.  LDL goal is less than 70 in people with diabetes.  Would recommend switching from simvastatin to Crestor 5 mg daily if patient is amenable.  Okay to send 90-day supply with 3 refills if patient agrees.  Recheck cholesterol at next visit

## 2020-09-27 ENCOUNTER — Other Ambulatory Visit: Payer: Self-pay | Admitting: Family Medicine

## 2020-09-27 ENCOUNTER — Telehealth: Payer: Self-pay

## 2020-09-27 DIAGNOSIS — Z1231 Encounter for screening mammogram for malignant neoplasm of breast: Secondary | ICD-10-CM

## 2020-09-27 NOTE — Telephone Encounter (Signed)
Copied from CRM [id number]. Topic: Appointment Scheduling - Scheduling Inquiry for Clinic >> Sep 27, 2020  3:53 PM [name] wrote: Reason for CRM: Pt stated she would like to schedule appt for annual physical and Dr. B told her to call and get scheduled with the new provider. Pt requests call back to schedule annual physical with Madison Mason.

## 2020-09-28 NOTE — Telephone Encounter (Signed)
Left vm with pt's husband to call back.  If pt does not return call try again Monday.

## 2020-09-28 NOTE — Telephone Encounter (Signed)
Pt returned call and appt was scheduled.  

## 2020-10-25 LAB — HM DIABETES EYE EXAM

## 2020-12-05 ENCOUNTER — Ambulatory Visit (INDEPENDENT_AMBULATORY_CARE_PROVIDER_SITE_OTHER): Payer: BC Managed Care – PPO | Admitting: Family Medicine

## 2020-12-05 ENCOUNTER — Other Ambulatory Visit: Payer: Self-pay

## 2020-12-05 ENCOUNTER — Encounter: Payer: Self-pay | Admitting: Family Medicine

## 2020-12-05 VITALS — BP 136/69 | HR 72 | Resp 16 | Ht 66.0 in | Wt 198.2 lb

## 2020-12-05 DIAGNOSIS — K219 Gastro-esophageal reflux disease without esophagitis: Secondary | ICD-10-CM | POA: Insufficient documentation

## 2020-12-05 DIAGNOSIS — Z23 Encounter for immunization: Secondary | ICD-10-CM | POA: Insufficient documentation

## 2020-12-05 DIAGNOSIS — R770 Abnormality of albumin: Secondary | ICD-10-CM

## 2020-12-05 DIAGNOSIS — E119 Type 2 diabetes mellitus without complications: Secondary | ICD-10-CM | POA: Diagnosis not present

## 2020-12-05 DIAGNOSIS — Z0000 Encounter for general adult medical examination without abnormal findings: Secondary | ICD-10-CM | POA: Diagnosis not present

## 2020-12-05 DIAGNOSIS — E1169 Type 2 diabetes mellitus with other specified complication: Secondary | ICD-10-CM | POA: Diagnosis not present

## 2020-12-05 DIAGNOSIS — E7800 Pure hypercholesterolemia, unspecified: Secondary | ICD-10-CM | POA: Insufficient documentation

## 2020-12-05 DIAGNOSIS — E785 Hyperlipidemia, unspecified: Secondary | ICD-10-CM

## 2020-12-05 DIAGNOSIS — E782 Mixed hyperlipidemia: Secondary | ICD-10-CM

## 2020-12-05 DIAGNOSIS — E1122 Type 2 diabetes mellitus with diabetic chronic kidney disease: Secondary | ICD-10-CM | POA: Insufficient documentation

## 2020-12-05 MED ORDER — ONETOUCH VERIO VI STRP: ORAL_STRIP | 2 refills | Status: DC

## 2020-12-05 MED ORDER — ZOSTER VAC RECOMB ADJUVANTED 50 MCG/0.5ML IM SUSR: 0.5000 mL | Freq: Once | INTRAMUSCULAR | 0 refills | Status: AC

## 2020-12-05 NOTE — Assessment & Plan Note (Signed)
Recommend referral to lipid specialist given high trigs

## 2020-12-05 NOTE — Assessment & Plan Note (Signed)
Rx sent into local pharmacy. °

## 2020-12-05 NOTE — Assessment & Plan Note (Signed)
UTD on eye dr and dentist Things to do to keep yourself healthy  - Exercise at least 30-45 minutes a day, 3-4 days a week.  - Eat a low-fat diet with lots of fruits and vegetables, up to 7-9 servings per day.  - Seatbelts can save your life. Wear them always.  - Smoke detectors on every level of your home, check batteries every year.  - Eye Doctor - have an eye exam every 1-2 years  - Safe sex - if you may be exposed to STDs, use a condom.  - Alcohol -  If you drink, do it moderately, less than 2 drinks per day.  - Health Care Power of Attorney. Choose someone to speak for you if you are not able.  - Depression is common in our stressful world.If you're feeling down or losing interest in things you normally enjoy, please come in for a visit.  - Violence - If anyone is threatening or hurting you, please call immediately.

## 2020-12-05 NOTE — Assessment & Plan Note (Signed)
Recommend repeat lipid panel

## 2020-12-05 NOTE — Assessment & Plan Note (Signed)
Not on insulin; no known complications extending beyond hld

## 2020-12-05 NOTE — Assessment & Plan Note (Signed)
Chronic, stable No complaints unless misses medication

## 2020-12-05 NOTE — Progress Notes (Signed)
Complete physical exam   Patient: Madison Mason   DOB: 04/30/1955   65 y.o. Female  MRN: [id number] Visit Date: 12/05/2020  Today's healthcare provider: Gwyneth Sprout, FNP   Chief Complaint  Patient presents with   Annual Exam   Subjective     HPI  Madison Mason is a 65 y.o. female who presents today for a complete physical exam.  She reports consuming a general diet. Home exercise routine includes walking. She generally feels well. She reports sleeping fairly well. She does not have additional problems to discuss today.   Past Medical History:  Diagnosis Date   GERD (gastroesophageal reflux disease)    Hyperlipidemia    Past Surgical History:  Procedure Laterality Date   CESAREAN SECTION  1989 and 1986   Social History   Socioeconomic History   Marital status: Married    Spouse name: Not on file   Number of children: Not on file   Years of education: Not on file   Highest education level: Not on file  Occupational History   Not on file  Tobacco Use   Smoking status: Former    Packs/day: 1.00    Years: 25.00    Pack years: 25.00    Types: Cigarettes    Quit date: 01/05/1997    Years since quitting: 23.9   Smokeless tobacco: Never   Tobacco comments:    Quit in 1988  Substance and Sexual Activity   Alcohol use: Yes    Comment: occasionally   Drug use: No   Sexual activity: Yes  Other Topics Concern   Not on file  Social History Narrative   Not on file   Social Determinants of Health   Financial Resource Strain: Not on file  Food Insecurity: Not on file  Transportation Needs: Not on file  Physical Activity: Not on file  Stress: Not on file  Social Connections: Not on file  Intimate Partner Violence: Not on file   Family Status  Relation Name Status   Father  Alive   Mother  Alive   Sister  Alive   Sister  Alive   Sister  Alive   Mat Aunt  (Not Specified)   Programmer, systems  (Not Specified)   Family History  Problem Relation Age of Onset    Cancer Father        lung   Heart disease Father    Diabetes Mother    Healthy Sister    Healthy Sister    Healthy Sister    Breast cancer Maternal Aunt    Breast cancer Maternal Aunt    Allergies  Allergen Reactions   Sulfa Antibiotics Rash    Patient Care Team: Gwyneth Sprout, FNP as PCP - General (Family Medicine)   Medications: Outpatient Medications Prior to Visit  Medication Sig   Blood Glucose Monitoring Suppl (ONETOUCH ULTRALINK) w/Device KIT To check blood sugar once daily.   cholecalciferol (VITAMIN D) 1000 UNITS tablet Take 1,000 Units by mouth daily.   famotidine (PEPCID) 10 MG tablet Take 10 mg by mouth daily.   Lancets (ONETOUCH DELICA PLUS [id number]) MISC USE LANCET TO CHECK BLOOD SUGAR ONCE DAILY   loratadine-pseudoephedrine (CLARITIN-D 12-HOUR) 5-120 MG tablet CLARITIN-D 12 HOUR, 5-120MG (Oral Tablet Extended Release 12 Hour)  1 Every Day for 0 days  Quantity: 0.00;  Refills: 0   Ordered :21-Aug-2010  Ashley Royalty ;  Started 13-Aug-2009 Active   metFORMIN (GLUCOPHAGE) 1000 MG tablet Take 1  tablet (1,000 mg total) by mouth 2 (two) times daily with a meal.   Misc Natural Products (OSTEO BI-FLEX ADV TRIPLE ST) TABS Take 1 tablet by mouth daily.   MULTIPLE VITAMINS-MINERALS PO Take 1 tablet by mouth daily.    rosuvastatin (CRESTOR) 5 MG tablet Take 1 tablet (5 mg total) by mouth daily.   [DISCONTINUED] glucose blood (ONETOUCH VERIO) test strip USE STRIP TO CHECK BLOOD SUGAR twice DAILY   [DISCONTINUED] amoxicillin (AMOXIL) 500 MG capsule Take 2081m (4 capsules) prior to dental procedures or cleanings.   No facility-administered medications prior to visit.    Review of Systems  All other systems reviewed and are negative.    Objective    BP 136/69   Pulse 72   Resp 16   Ht _0  (1.676 m)   Wt 198 lb 3.2 oz (89.9 kg)   SpO2 100%   BMI 31.99 kg/m    Physical Exam Vitals and nursing note reviewed.  Constitutional:      General: She is awake.  She is not in acute distress.    Appearance: Normal appearance. She is well-developed and well-groomed. She is obese. She is not ill-appearing, toxic-appearing or diaphoretic.  HENT:     Head: Normocephalic and atraumatic.     Jaw: There is normal jaw occlusion. No trismus, tenderness, swelling or pain on movement.     Right Ear: Hearing, tympanic membrane, ear canal and external ear normal. There is no impacted cerumen.     Left Ear: Hearing, tympanic membrane, ear canal and external ear normal. There is no impacted cerumen.     Nose: Nose normal. No congestion or rhinorrhea.     Right Turbinates: Not enlarged, swollen or pale.     Left Turbinates: Not enlarged, swollen or pale.     Right Sinus: No maxillary sinus tenderness or frontal sinus tenderness.     Left Sinus: No maxillary sinus tenderness or frontal sinus tenderness.     Mouth/Throat:     Lips: Pink.     Mouth: Mucous membranes are moist. No injury.     Tongue: No lesions.     Pharynx: Oropharynx is clear. Uvula midline. No pharyngeal swelling, oropharyngeal exudate, posterior oropharyngeal erythema or uvula swelling.     Tonsils: No tonsillar exudate or tonsillar abscesses.  Eyes:     General: Lids are normal. Lids are everted, no foreign bodies appreciated. Vision grossly intact. Gaze aligned appropriately. No allergic shiner or visual field deficit.       Right eye: No discharge.        Left eye: No discharge.     Extraocular Movements: Extraocular movements intact.     Conjunctiva/sclera: Conjunctivae normal.     Right eye: Right conjunctiva is not injected. No exudate.    Left eye: Left conjunctiva is not injected. No exudate.    Pupils: Pupils are equal, round, and reactive to light.  Neck:     Thyroid: No thyroid mass, thyromegaly or thyroid tenderness.     Vascular: No carotid bruit.     Trachea: Trachea normal.  Cardiovascular:     Rate and Rhythm: Normal rate and regular rhythm.     Pulses: Normal pulses.           Carotid pulses are 2+ on the right side and 2+ on the left side.      Radial pulses are 2+ on the right side and 2+ on the left side.       Dorsalis  pedis pulses are 2+ on the right side and 2+ on the left side.       Posterior tibial pulses are 2+ on the right side and 2+ on the left side.     Heart sounds: Normal heart sounds, S1 normal and S2 normal. No murmur heard.   No friction rub. No gallop.  Pulmonary:     Effort: Pulmonary effort is normal. No respiratory distress.     Breath sounds: Normal breath sounds and air entry. No stridor. No wheezing, rhonchi or rales.  Chest:     Chest wall: No tenderness.     Comments: Breast exam deferred; discussed 'know your lemons' campaign and self exam Abdominal:     General: Abdomen is flat. Bowel sounds are normal. There is no distension.     Palpations: Abdomen is soft. There is no mass.     Tenderness: There is no abdominal tenderness. There is no right CVA tenderness, left CVA tenderness, guarding or rebound.     Hernia: No hernia is present.  Genitourinary:    Comments: Exam deferred; denies complaints Musculoskeletal:        General: No swelling, tenderness, deformity or signs of injury. Normal range of motion.     Cervical back: Full passive range of motion without pain, normal range of motion and neck supple. No edema, rigidity or tenderness. No muscular tenderness.     Right lower leg: No edema.     Left lower leg: No edema.  Lymphadenopathy:     Cervical: No cervical adenopathy.     Right cervical: No superficial, deep or posterior cervical adenopathy.    Left cervical: No superficial, deep or posterior cervical adenopathy.  Skin:    General: Skin is warm and dry.     Capillary Refill: Capillary refill takes less than 2 seconds.     Coloration: Skin is not jaundiced or pale.     Findings: No bruising, erythema, lesion or rash.  Neurological:     General: No focal deficit present.     Mental Status: She is alert and oriented  to person, place, and time. Mental status is at baseline.     GCS: GCS eye subscore is 4. GCS verbal subscore is 5. GCS motor subscore is 6.     Sensory: Sensation is intact. No sensory deficit.     Motor: Motor function is intact. No weakness.     Coordination: Coordination is intact. Coordination normal.     Gait: Gait is intact. Gait normal.  Psychiatric:        Attention and Perception: Attention and perception normal.        Mood and Affect: Mood and affect normal.        Speech: Speech normal.        Behavior: Behavior normal. Behavior is cooperative.        Thought Content: Thought content normal.        Cognition and Memory: Cognition and memory normal.        Judgment: Judgment normal.     Last depression screening scores PHQ 2/9 Scores 05/31/2020 11/30/2019 11/24/2018  PHQ - 2 Score 0 0 0  PHQ- 9 Score 0 2 -   Last fall risk screening Fall Risk  05/31/2020  Falls in the past year? 0  Number falls in past yr: 0  Injury with Fall? 0  Risk for fall due to : No Fall Risks  Follow up Falls evaluation completed   Last Audit-C alcohol use  screening Alcohol Use Disorder Test (AUDIT) 05/31/2020  1. How often do you have a drink containing alcohol? 0  2. How many drinks containing alcohol do you have on a typical day when you are drinking? 0  3. How often do you have six or more drinks on one occasion? 0  AUDIT-C Score 0  Alcohol Brief Interventions/Follow-up -   A score of 3 or more in women, and 4 or more in men indicates increased risk for alcohol abuse, EXCEPT if all of the points are from question 1   No results found for any visits on 12/05/20.  Assessment & Plan    Routine Health Maintenance and Physical Exam  Exercise Activities and Dietary recommendations  Goals      Exercise 150 minutes per week (moderate activity)        Immunization History  Administered Date(s) Administered   Td 11/20/2017   Tdap 07/14/2007    Health Maintenance  Topic Date Due    COVID-19 Vaccine (1) Never done   Pneumococcal Vaccine 53-54 Years old (1 - PCV) Never done   Zoster Vaccines- Shingrix (1 of 2) Never done   HEMOGLOBIN A1C  12/01/2020   FOOT EXAM  05/31/2021   URINE MICROALBUMIN  05/31/2021   OPHTHALMOLOGY EXAM  10/25/2021   PAP SMEAR-Modifier  11/23/2021   Fecal DNA (Cologuard)  12/05/2021   MAMMOGRAM  12/11/2021   TETANUS/TDAP  11/21/2027   Hepatitis C Screening  Completed   HPV VACCINES  Aged Out   HIV Screening  Discontinued    Discussed health benefits of physical activity, and encouraged her to engage in regular exercise appropriate for her age and condition.  Problem List Items Addressed This Visit       Digestive   Gastroesophageal reflux disease    Chronic, stable No complaints unless misses medication        Endocrine   Hyperlipidemia associated with type 2 diabetes mellitus ([place])    Recommend repeat lipid panel       Relevant Orders   Hemoglobin A1c   Urine Microalbumin w/creat. ratio   Type 2 diabetes mellitus without complication, without long-term current use of insulin (HCC)    Not on insulin; no known complications extending beyond hld      Relevant Medications   glucose blood (ONETOUCH VERIO) test strip   Diabetes mellitus (HCC)     Other   Elevated triglycerides with high cholesterol    Recommend referral to lipid specialist given high trigs      Relevant Orders   AMB Referral to [facility]   Pure hypercholesterolemia   Annual physical exam - Primary    UTD on eye dr and dentist Things to do to keep yourself healthy  - Exercise at least 30-45 minutes a day, 3-4 days a week.  - Eat a low-fat diet with lots of fruits and vegetables, up to 7-9 servings per day.  - Seatbelts can save your life. Wear them always.  - Smoke detectors on every level of your home, check batteries every year.  - Eye Doctor - have an eye exam every 1-2 years  - Safe sex - if you may be exposed to STDs, use a  condom.  - Alcohol -  If you drink, do it moderately, less than 2 drinks per day.  - [place]. Choose someone to speak for you if you are not able.  - Depression is common in our stressful world.If you're  feeling down or losing interest in things you normally enjoy, please come in for a visit.  - Violence - If anyone is threatening or hurting you, please call immediately.        Need for shingles vaccine    Rx sent in to local pharmacy      Relevant Medications   Zoster Vaccine Adjuvanted [facility]) injection   Other Visit Diagnoses     Abnormality of albumin       Relevant Orders   Comprehensive metabolic panel        Return in about 6 months (around 06/04/2021) for T2DM management.     Vonna Kotyk, FNP, have reviewed all documentation for this visit. The documentation on 12/05/20 for the exam, diagnosis, procedures, and orders are all accurate and complete.    Gwyneth Sprout, [place] [phone] (phone) [phone] (fax)  [place]

## 2020-12-06 LAB — MICROALBUMIN / CREATININE URINE RATIO
Creatinine, Urine: 59.6 mg/dL
Microalb/Creat Ratio: <5 mg/g creat (ref 0–29)
Microalbumin, Urine: <3 ug/mL

## 2020-12-06 LAB — HEMOGLOBIN A1C
Est. average glucose Bld gHb Est-mCnc: 134 mg/dL
Hgb A1c MFr Bld: 6.3 % — ABNORMAL HIGH (ref 4.8–5.6)

## 2020-12-06 LAB — COMPREHENSIVE METABOLIC PANEL
ALT: 14 IU/L (ref 0–32)
AST: 19 IU/L (ref 0–40)
Albumin/Globulin Ratio: 1.8 (ref 1.2–2.2)
Albumin: 4.8 g/dL (ref 3.8–4.8)
Alkaline Phosphatase: 78 IU/L (ref 44–121)
BUN/Creatinine Ratio: 14 (ref 12–28)
BUN: 12 mg/dL (ref 8–27)
Bilirubin Total: 0.3 mg/dL (ref 0.0–1.2)
CO2: 25 mmol/L (ref 20–29)
Calcium: 10.1 mg/dL (ref 8.7–10.3)
Chloride: 99 mmol/L (ref 96–106)
Creatinine, Ser: 0.87 mg/dL (ref 0.57–1.00)
Globulin, Total: 2.7 g/dL (ref 1.5–4.5)
Glucose: 108 mg/dL — ABNORMAL HIGH (ref 70–99)
Potassium: 4.9 mmol/L (ref 3.5–5.2)
Sodium: 138 mmol/L (ref 134–144)
Total Protein: 7.5 g/dL (ref 6.0–8.5)
eGFR: 75 mL/min/{1.73_m2} (ref >59)

## 2020-12-12 ENCOUNTER — Ambulatory Visit
Admission: RE | Admit: 2020-12-12 | Discharge: 2020-12-12 | Disposition: A | Discharge status: Home / Self Care | Payer: BC Managed Care – PPO | Source: Ambulatory Visit | Attending: Family Medicine | Admitting: Family Medicine

## 2020-12-12 ENCOUNTER — Other Ambulatory Visit: Payer: Self-pay

## 2020-12-12 DIAGNOSIS — Z1231 Encounter for screening mammogram for malignant neoplasm of breast: Secondary | ICD-10-CM | POA: Diagnosis present

## 2021-04-11 ENCOUNTER — Encounter: Payer: Self-pay | Admitting: Family Medicine

## 2021-04-11 ENCOUNTER — Other Ambulatory Visit: Payer: Self-pay

## 2021-04-11 DIAGNOSIS — E119 Type 2 diabetes mellitus without complications: Secondary | ICD-10-CM

## 2021-04-11 MED ORDER — METFORMIN HCL 1000 MG PO TABS: 1000.0000 mg | ORAL_TABLET | Freq: Two times a day (BID) | ORAL | 3 refills | Status: DC

## 2021-05-23 ENCOUNTER — Encounter: Payer: Self-pay | Admitting: Family Medicine

## 2021-05-28 MED ORDER — ROSUVASTATIN CALCIUM 5 MG PO TABS: 5.0000 mg | ORAL_TABLET | Freq: Every day | ORAL | 3 refills | Status: DC

## 2021-06-04 ENCOUNTER — Ambulatory Visit: Payer: BC Managed Care – PPO | Admitting: Family Medicine

## 2021-06-04 ENCOUNTER — Other Ambulatory Visit: Payer: Self-pay | Admitting: Family Medicine

## 2021-06-04 ENCOUNTER — Encounter: Payer: Self-pay | Admitting: Family Medicine

## 2021-06-04 VITALS — BP 125/83 | HR 78 | Temp 97.4°F | Resp 16 | Ht 66.0 in | Wt 201.1 lb

## 2021-06-04 DIAGNOSIS — E1169 Type 2 diabetes mellitus with other specified complication: Secondary | ICD-10-CM | POA: Diagnosis not present

## 2021-06-04 DIAGNOSIS — Z96641 Presence of right artificial hip joint: Secondary | ICD-10-CM | POA: Diagnosis not present

## 2021-06-04 DIAGNOSIS — E785 Hyperlipidemia, unspecified: Secondary | ICD-10-CM

## 2021-06-04 DIAGNOSIS — R5383 Other fatigue: Secondary | ICD-10-CM | POA: Diagnosis not present

## 2021-06-04 MED ORDER — AMOXICILLIN 500 MG PO CAPS: ORAL_CAPSULE | ORAL | 0 refills | Status: DC

## 2021-06-04 NOTE — Progress Notes (Signed)
Established patient visit   Patient: Madison Mason   DOB: 07-18-1955   66 y.o. Female  MRN: [id number] Visit Date: 06/04/2021  Today's healthcare provider: Gwyneth Sprout, FNP   I,Tiffany J Bragg,acting as a scribe for Gwyneth Sprout, FNP.,have documented all relevant documentation on the behalf of Gwyneth Sprout, FNP,as directed by  Gwyneth Sprout, FNP while in the presence of Gwyneth Sprout, FNP.   Chief Complaint  Patient presents with   Diabetes   Hyperlipidemia   Subjective    HPI  Diabetes Mellitus Type II, Follow-up  Lab Results  Component Value Date   HGBA1C 6.3 (H) 12/05/2020   HGBA1C 6.3 (A) 05/31/2020   HGBA1C 6.9 (H) 11/30/2019   Wt Readings from Last 3 Encounters:  06/04/21 201 lb 1.6 oz (91.2 kg)  12/05/20 198 lb 3.2 oz (89.9 kg)  05/31/20 207 lb (93.9 kg)   Last seen for diabetes 6 months ago.  Management since then includes continue medication. She reports excellent compliance with treatment. She is not having side effects.  Symptoms: No fatigue No foot ulcerations  No appetite changes No nausea  No paresthesia of the feet  No polydipsia  No polyuria No visual disturbances   No vomiting     Home blood sugar records:  95-105  Episodes of hypoglycemia? No   Most Recent Eye Exam: October 2022 Current exercise: bicycling and walking Current diet habits: well balanced  Pertinent Labs: Lab Results  Component Value Date   CHOL 218 (H) 05/31/2020   HDL 51 05/31/2020   LDLCALC 99 05/31/2020   TRIG 408 (H) 05/31/2020   CHOLHDL 4.3 05/31/2020   Lab Results  Component Value Date   NA 138 12/05/2020   K 4.9 12/05/2020   CREATININE 0.87 12/05/2020   EGFR 75 12/05/2020   MICROALBUR 20 05/31/2020   LABMICR <3.0 12/05/2020     ---------------------------------------------------------------------------------------------------  Lipid/Cholesterol, Follow-up  Last lipid panel Other pertinent labs  Lab Results  Component Value Date   CHOL 218  (H) 05/31/2020   HDL 51 05/31/2020   LDLCALC 99 05/31/2020   TRIG 408 (H) 05/31/2020   CHOLHDL 4.3 05/31/2020   Lab Results  Component Value Date   ALT 14 12/05/2020   AST 19 12/05/2020   PLT 376 11/30/2019   TSH 1.770 11/30/2019     She was last seen for this 6 months ago.  Management since that visit includes continue medications.  She reports excellent compliance with treatment. She is not having side effects.   Symptoms: No chest pain No chest pressure/discomfort  No dyspnea No lower extremity edema  No numbness or tingling of extremity No orthopnea  No palpitations No paroxysmal nocturnal dyspnea  No speech difficulty No syncope   Current diet: well balanced Current exercise: bicycling and walking  The 10-year ASCVD risk score (Arnett DK, et al., 2019) is: 9%  ---------------------------------------------------------------------------------------------------   Medications: Outpatient Medications Prior to Visit  Medication Sig   Blood Glucose Monitoring Suppl (ONETOUCH ULTRALINK) w/Device KIT To check blood sugar once daily.   cholecalciferol (VITAMIN D) 1000 UNITS tablet Take 1,000 Units by mouth daily.   famotidine (PEPCID) 10 MG tablet Take 10 mg by mouth daily.   glucose blood (ONETOUCH VERIO) test strip Check Blood Sugar Twice Daily   Lancets (ONETOUCH DELICA PLUS [id number]) MISC USE LANCET TO CHECK BLOOD SUGAR ONCE DAILY   loratadine-pseudoephedrine (CLARITIN-D 12-HOUR) 5-120 MG tablet CLARITIN-D 12 HOUR, 5-120MG (Oral Tablet Extended  Release 12 Hour)  1 Every Day for 0 days  Quantity: 0.00;  Refills: 0   Ordered :21-Aug-2010  Ashley Royalty ;  Started 13-Aug-2009 Active   metFORMIN (GLUCOPHAGE) 1000 MG tablet Take 1 tablet (1,000 mg total) by mouth 2 (two) times daily with a meal.   Misc Natural Products (OSTEO BI-FLEX ADV TRIPLE ST) TABS Take 1 tablet by mouth daily.   MULTIPLE VITAMINS-MINERALS PO Take 1 tablet by mouth daily.    rosuvastatin (CRESTOR) 5  MG tablet Take 1 tablet (5 mg total) by mouth daily.   No facility-administered medications prior to visit.    Review of Systems     Objective    BP 125/83 (BP Location: Right Arm, Patient Position: Sitting, Cuff Size: Normal)   Pulse 78   Temp (!) 97.4 F (36.3 C) (Oral)   Resp 16   Ht _0  (1.676 m)   Wt 201 lb 1.6 oz (91.2 kg)   SpO2 100%   BMI 32.46 kg/m    Physical Exam Vitals and nursing note reviewed.  Constitutional:      General: She is not in acute distress.    Appearance: Normal appearance. She is obese. She is not ill-appearing, toxic-appearing or diaphoretic.  HENT:     Head: Normocephalic and atraumatic.  Cardiovascular:     Rate and Rhythm: Normal rate and regular rhythm.     Pulses: Normal pulses.     Heart sounds: Normal heart sounds. No murmur heard.   No friction rub. No gallop.  Pulmonary:     Effort: Pulmonary effort is normal. No respiratory distress.     Breath sounds: Normal breath sounds. No stridor. No wheezing, rhonchi or rales.  Chest:     Chest wall: No tenderness.  Abdominal:     General: Bowel sounds are normal.     Palpations: Abdomen is soft.  Musculoskeletal:        General: No swelling, tenderness, deformity or signs of injury. Normal range of motion.     Right lower leg: No edema.     Left lower leg: No edema.  Skin:    General: Skin is warm and dry.     Capillary Refill: Capillary refill takes less than 2 seconds.     Coloration: Skin is not jaundiced or pale.     Findings: No bruising, erythema, lesion or rash.  Neurological:     General: No focal deficit present.     Mental Status: She is alert and oriented to person, place, and time. Mental status is at baseline.     Cranial Nerves: No cranial nerve deficit.     Sensory: No sensory deficit.     Motor: No weakness.     Coordination: Coordination normal.  Psychiatric:        Mood and Affect: Mood normal.        Behavior: Behavior normal.        Thought Content:  Thought content normal.        Judgment: Judgment normal.     No results found for any visits on 06/04/21.  Assessment & Plan     Problem List Items Addressed This Visit       Endocrine   Diabetes mellitus ([place]) - Primary    Chronic, stable Repeat A1c Continue metformin- 1000 mg BID DM foot exam completed BP at goal- not on ace or arb On Crestor 5 mg- repeat lipid panel  CMP stable in 11/2020 Recommend annual DM eye exam Continue  to recommend balanced, lower carb meals. Smaller meal size, adding snacks. Choosing water as drink of choice and increasing purposeful exercise.        Relevant Orders   Hemoglobin A1c   Hyperlipidemia associated with type 2 diabetes mellitus (HCC)    Chronic, stable LDL goal <70 with DM Was <100 previous I recommend diet low in saturated fat and regular exercise - 30 min at least 5 times per week Repeat lipids On crestor 5 mg        Relevant Orders   Lipid panel     Other   Fatigue    Acute, unknown cause Check CBC for anemia       Relevant Orders   CBC with Differential/Platelet   Morbid obesity (HCC)    Chronic, stable Body mass index is 32.46 kg/m. Discussed importance of healthy weight management Discussed diet and exercise       Status post right hip replacement    Continue antibiotics per recommendations of dentist due to artificial joint upcoming crown replacement        Relevant Medications   amoxicillin (AMOXIL) 500 MG capsule     Return in about 6 months (around 12/05/2021) for annual examination.      Vonna Kotyk, FNP, have reviewed all documentation for this visit. The documentation on 06/04/21 for the exam, diagnosis, procedures, and orders are all accurate and complete.    Gwyneth Sprout, [place] [phone] (phone) [phone] (fax)  [place]

## 2021-06-04 NOTE — Assessment & Plan Note (Signed)
Continue antibiotics per recommendations of dentist due to artificial joint upcoming crown replacement

## 2021-06-04 NOTE — Assessment & Plan Note (Signed)
Chronic, stable Body mass index is 32.46 kg/m. Discussed importance of healthy weight management Discussed diet and exercise

## 2021-06-04 NOTE — Assessment & Plan Note (Signed)
Chronic, stable LDL goal <70 with DM Was <100 previous I recommend diet low in saturated fat and regular exercise - 30 min at least 5 times per week Repeat lipids On crestor 5 mg

## 2021-06-04 NOTE — Assessment & Plan Note (Signed)
Acute, unknown cause Check CBC for anemia

## 2021-06-04 NOTE — Assessment & Plan Note (Signed)
Chronic, stable Repeat A1c Continue metformin- 1000 mg BID DM foot exam completed BP at goal- not on ace or arb On Crestor 5 mg- repeat lipid panel  CMP stable in 11/2020 Recommend annual DM eye exam Continue to recommend balanced, lower carb meals. Smaller meal size, adding snacks. Choosing water as drink of choice and increasing purposeful exercise.

## 2021-06-05 LAB — LIPID PANEL
Chol/HDL Ratio: 3.3 ratio (ref 0.0–4.4)
Cholesterol, Total: 190 mg/dL (ref 100–199)
HDL: 58 mg/dL (ref >39)
LDL Chol Calc (NIH): 84 mg/dL (ref 0–99)
Triglycerides: 297 mg/dL — ABNORMAL HIGH (ref 0–149)
VLDL Cholesterol Cal: 48 mg/dL — ABNORMAL HIGH (ref 5–40)

## 2021-06-05 LAB — CBC WITH DIFFERENTIAL/PLATELET
Basophils Absolute: 0.1 10*3/uL (ref 0.0–0.2)
Basos: 1 %
EOS (ABSOLUTE): 0.2 10*3/uL (ref 0.0–0.4)
Eos: 3 %
Hematocrit: 39.1 % (ref 34.0–46.6)
Hemoglobin: 12.6 g/dL (ref 11.1–15.9)
Immature Grans (Abs): 0 10*3/uL (ref 0.0–0.1)
Immature Granulocytes: 0 %
Lymphocytes Absolute: 2.3 10*3/uL (ref 0.7–3.1)
Lymphs: 37 %
MCH: 28.3 pg (ref 26.6–33.0)
MCHC: 32.2 g/dL (ref 31.5–35.7)
MCV: 88 fL (ref 79–97)
Monocytes Absolute: 0.4 10*3/uL (ref 0.1–0.9)
Monocytes: 6 %
Neutrophils Absolute: 3.4 10*3/uL (ref 1.4–7.0)
Neutrophils: 53 %
Platelets: 370 10*3/uL (ref 150–450)
RBC: 4.45 x10E6/uL (ref 3.77–5.28)
RDW: 13.6 % (ref 11.7–15.4)
WBC: 6.4 10*3/uL (ref 3.4–10.8)

## 2021-06-05 LAB — HEMOGLOBIN A1C
Est. average glucose Bld gHb Est-mCnc: 131 mg/dL
Hgb A1c MFr Bld: 6.2 % — ABNORMAL HIGH (ref 4.8–5.6)

## 2021-08-17 IMAGING — MG DIGITAL SCREENING BILAT W/ TOMO W/ CAD
8 series · 8 of 24 positions shown · non-contrast
Comparison: Previous exam(s).

CLINICAL DATA: Screening.

EXAM:
DIGITAL SCREENING BILATERAL MAMMOGRAM WITH TOMO AND CAD

[L CC synth-2D]
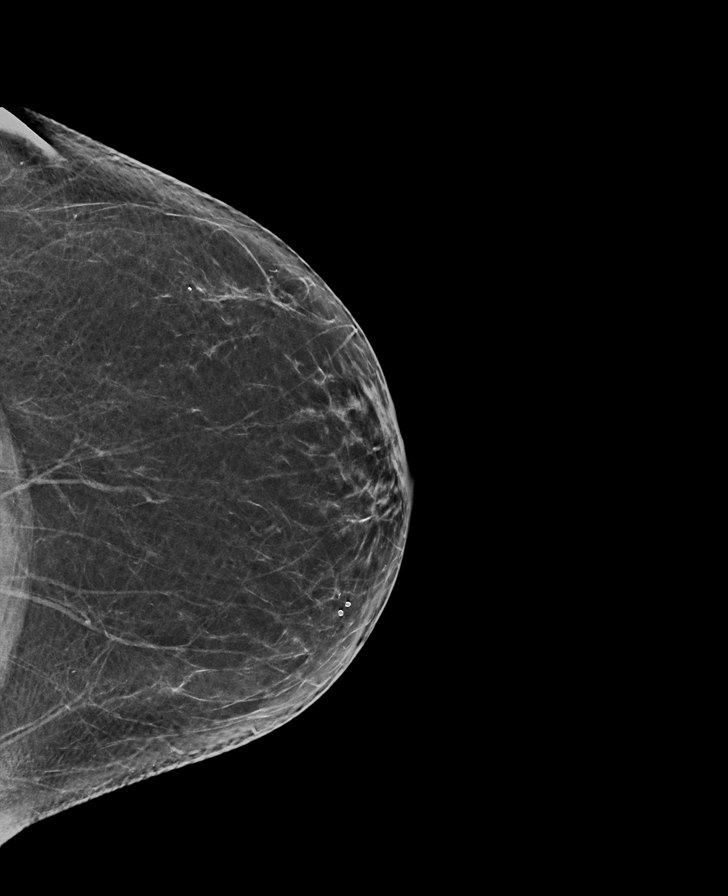

[R CC synth-2D]
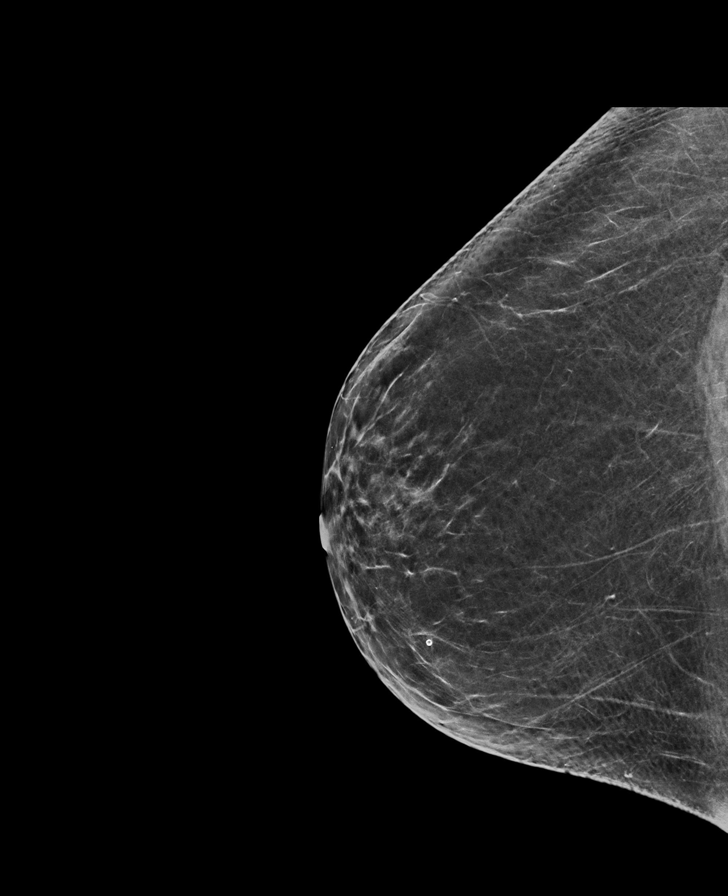

[L MLO synth-2D]
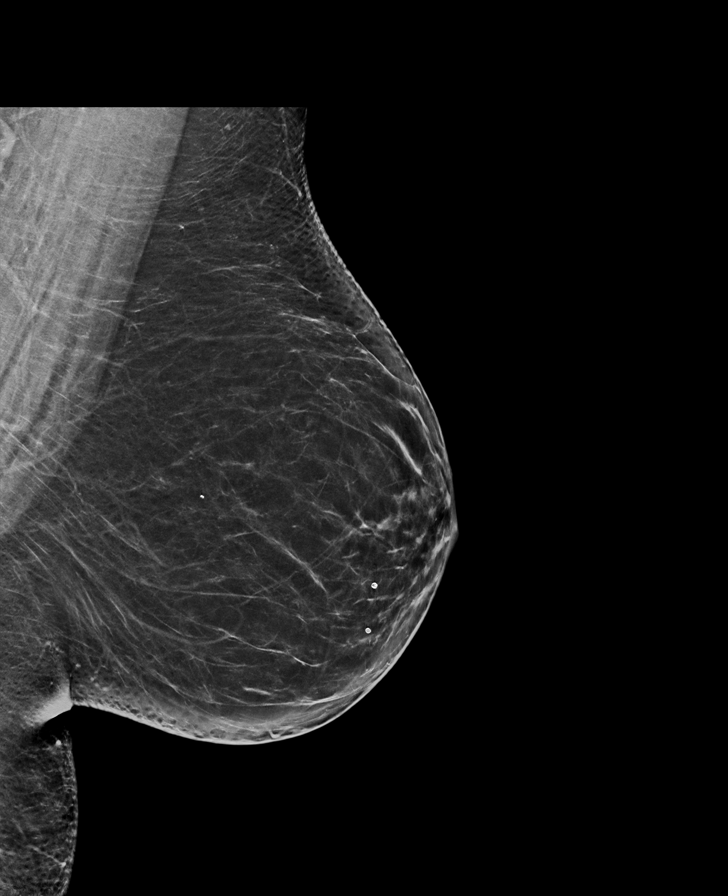

[R MLO synth-2D]
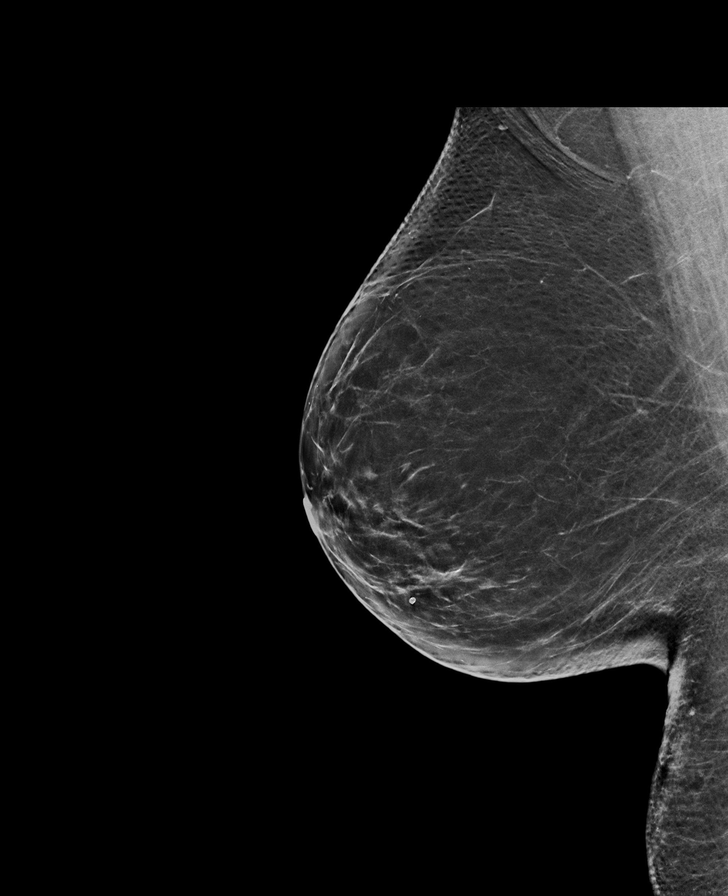

[R MLO tomo · tomo slice 39/76.0]
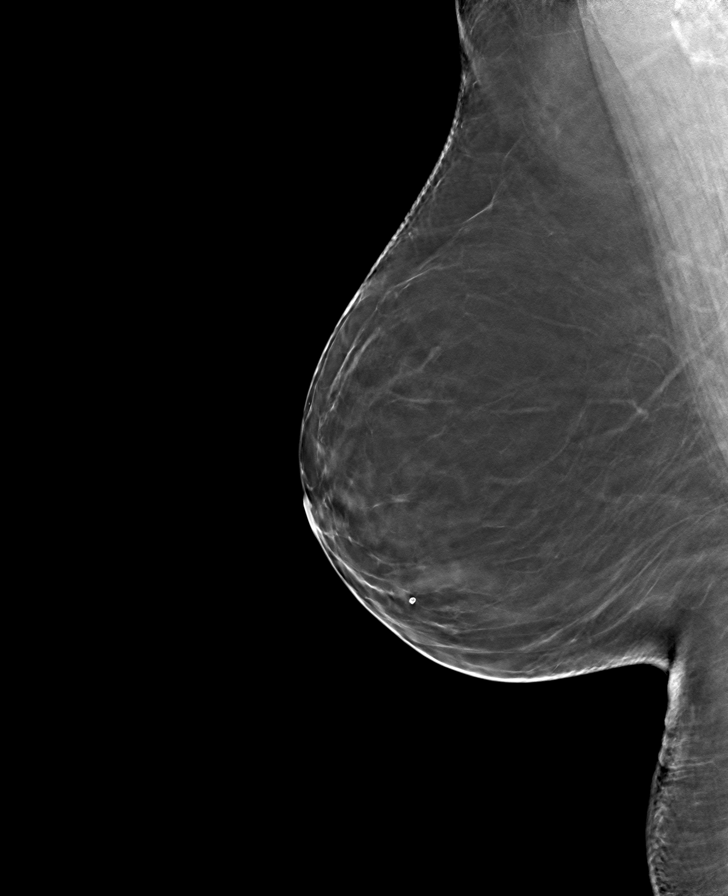

[L CC tomo · tomo slice 34/67.0]
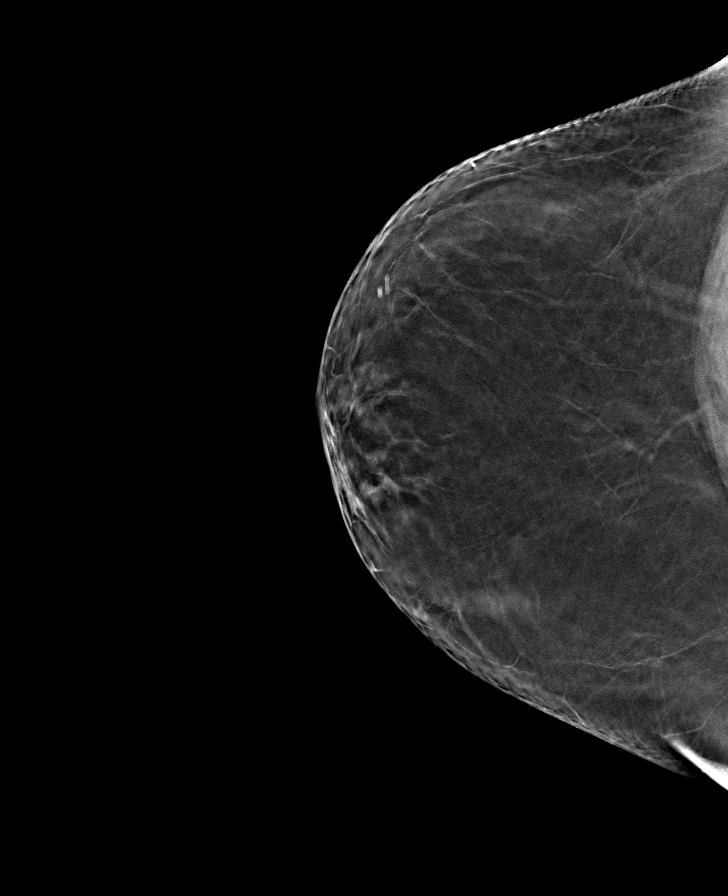

[L MLO tomo · tomo slice 38/75.0]
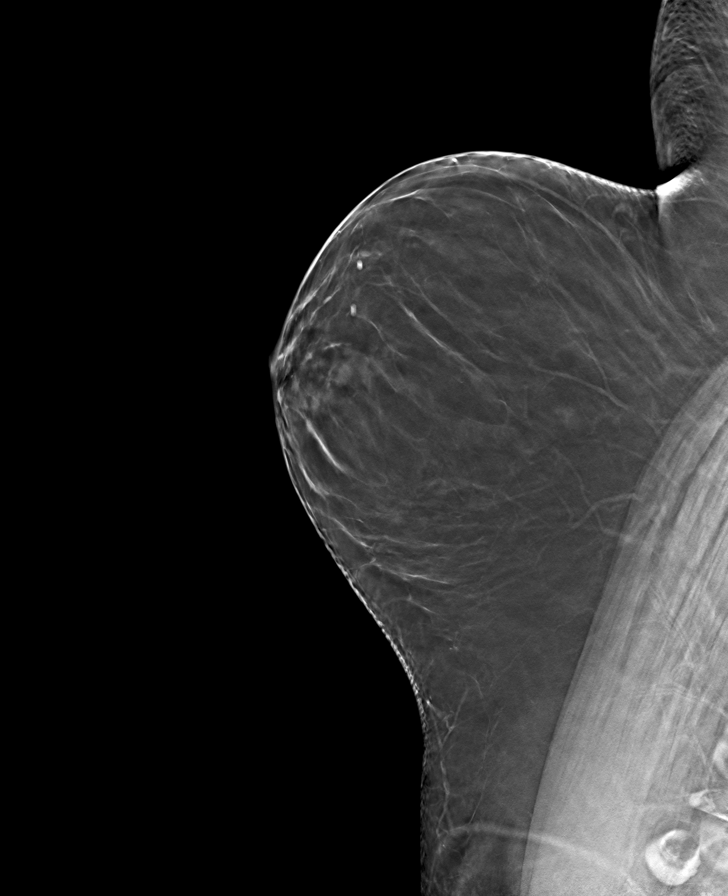

[R CC tomo · tomo slice 35/70.0]
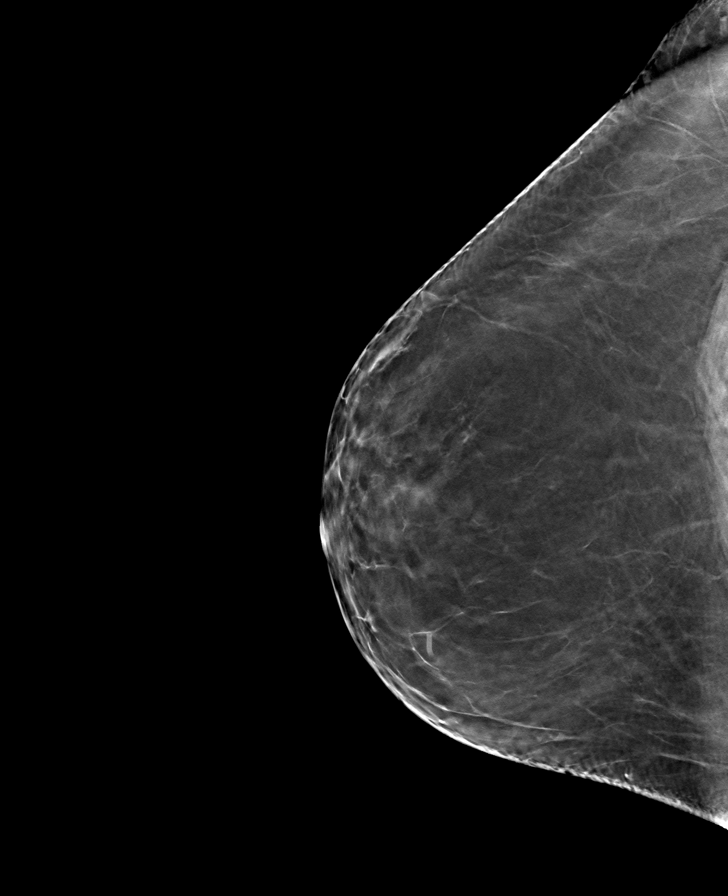

[8 of 24 positions shown; findings below may reference images not displayed]

ACR Breast Density Category b: There are scattered areas of
fibroglandular density.
FINDINGS: There are no findings suspicious for malignancy. Images were
processed with CAD.
IMPRESSION: No mammographic evidence of malignancy. A result letter of this
screening mammogram will be mailed directly to the patient.

RECOMMENDATION:
Screening mammogram in one year. (Code:[id number])

BI-RADS CATEGORY  1: Negative.

## 2021-10-17 ENCOUNTER — Other Ambulatory Visit: Payer: Self-pay | Admitting: Family Medicine

## 2021-10-17 DIAGNOSIS — Z1231 Encounter for screening mammogram for malignant neoplasm of breast: Secondary | ICD-10-CM

## 2021-10-28 LAB — HM DIABETES EYE EXAM

## 2021-12-06 NOTE — Progress Notes (Unsigned)
Complete physical exam   Patient: Madison Mason   DOB: 08-15-1955   66 y.o. Female  MRN: [id number] Visit Date: 12/10/2021  Today's healthcare provider: Gwyneth Sprout, FNP  Re Introduced to nurse practitioner role and practice setting.  All questions answered.  Discussed provider/patient relationship and expectations  Madison Mason,acting as a scribe for Gwyneth Sprout, FNP.,have documented all relevant documentation on the behalf of Gwyneth Sprout, FNP,as directed by  Gwyneth Sprout, FNP while in the presence of Gwyneth Sprout, FNP.   Chief Complaint  Patient presents with   Annual Exam   Subjective    Madison Mason is a 67 y.o. female who presents today for a complete physical exam.  She reports consuming a general diet. Home exercise routine includes walking. She generally feels well. She reports sleeping fairly well. She does not have additional problems to discuss today.  HPI   Past Medical History:  Diagnosis Date   GERD (gastroesophageal reflux disease)    Hyperlipidemia    Past Surgical History:  Procedure Laterality Date   CESAREAN SECTION  1989 and 1986   Social History   Socioeconomic History   Marital status: Married    Spouse name: Not on file   Number of children: Not on file   Years of education: Not on file   Highest education level: Not on file  Occupational History   Not on file  Tobacco Use   Smoking status: Former    Packs/day: 1.00    Years: 25.00    Total pack years: 25.00    Types: Cigarettes    Quit date: 01/05/1997    Years since quitting: 24.9   Smokeless tobacco: Never   Tobacco comments:    Quit in 1988  Substance and Sexual Activity   Alcohol use: Yes    Comment: occasionally   Drug use: No   Sexual activity: Yes  Other Topics Concern   Not on file  Social History Narrative   Not on file   Social Determinants of Health   Financial Resource Strain: Not on file  Food Insecurity: Not on file  Transportation Needs: Not  on file  Physical Activity: Not on file  Stress: Not on file  Social Connections: Unknown (11/19/2016)   Social Connection and Isolation Panel [NHANES]    Frequency of Communication with Friends and Family: Patient refused    Frequency of Social Gatherings with Friends and Family: Patient refused    Attends Religious Services: Patient refused    Active Member of Clubs or Organizations: Patient refused    Attends Archivist Meetings: Patient refused    Marital Status: Patient refused  Intimate Partner Violence: Unknown (11/19/2016)   Humiliation, Afraid, Rape, and Kick questionnaire    Fear of Current or Ex-Partner: Patient refused    Emotionally Abused: Patient refused    Physically Abused: Patient refused    Sexually Abused: Patient refused   Family Status  Relation Name Status   Mother  Alive   Father  Alive   Sister  Alive   Sister  Alive   Sister  Alive   Mat Aunt  (Not Specified)   Programmer, systems  (Not Specified)   Family History  Problem Relation Age of Onset   Breast cancer Mother        14   Diabetes Mother    Cancer Father        lung   Heart disease Father  Breast cancer Sister        18   Healthy Sister    Healthy Sister    Healthy Sister    Breast cancer Maternal Aunt    Breast cancer Maternal Aunt    Allergies  Allergen Reactions   Sulfa Antibiotics Rash    Patient Care Team: Gwyneth Sprout, FNP as PCP - General (Family Medicine)   Medications: Outpatient Medications Prior to Visit  Medication Sig   Blood Glucose Monitoring Suppl (ONETOUCH ULTRALINK) w/Device KIT To check blood sugar once daily.   cholecalciferol (VITAMIN D) 1000 UNITS tablet Take 1,000 Units by mouth daily.   famotidine (PEPCID) 10 MG tablet Take 10 mg by mouth daily.   Lancets (ONETOUCH DELICA PLUS [id number]) MISC USE LANCET TO CHECK BLOOD SUGAR ONCE DAILY   loratadine-pseudoephedrine (CLARITIN-D 12-HOUR) 5-120 MG tablet CLARITIN-D 12 HOUR, 5-120MG (Oral Tablet Extended  Release 12 Hour)  1 Every Day for 0 days  Quantity: 0.00;  Refills: 0   Ordered :21-Aug-2010  Ashley Royalty ;  Started 13-Aug-2009 Active   metFORMIN (GLUCOPHAGE) 1000 MG tablet Take 1 tablet (1,000 mg total) by mouth 2 (two) times daily with a meal.   Misc Natural Products (OSTEO BI-FLEX ADV TRIPLE ST) TABS Take 1 tablet by mouth daily.   MULTIPLE VITAMINS-MINERALS PO Take 1 tablet by mouth daily.    rosuvastatin (CRESTOR) 5 MG tablet Take 1 tablet (5 mg total) by mouth daily.   [DISCONTINUED] amoxicillin (AMOXIL) 500 MG capsule Take 4 capsules prior to dental procedure   [DISCONTINUED] glucose blood (ONETOUCH VERIO) test strip Check Blood Sugar Twice Daily   No facility-administered medications prior to visit.    Review of Systems   Objective    BP 139/78 (BP Location: Right Arm, Patient Position: Sitting, Cuff Size: Large)   Pulse 66   Temp 97.7 F (36.5 C) (Oral)   Resp 16   Ht _0  (1.676 m)   Wt 204 lb (92.5 kg)   SpO2 100%   BMI 32.93 kg/m   Physical Exam Vitals and nursing note reviewed.  Constitutional:      General: She is awake. She is not in acute distress.    Appearance: Normal appearance. She is well-developed and well-groomed. She is obese. She is not ill-appearing, toxic-appearing or diaphoretic.  HENT:     Head: Normocephalic and atraumatic.     Jaw: There is normal jaw occlusion. No trismus, tenderness, swelling or pain on movement.     Right Ear: Hearing, tympanic membrane, ear canal and external ear normal. There is no impacted cerumen.     Left Ear: Hearing, tympanic membrane, ear canal and external ear normal. There is no impacted cerumen.     Nose: Nose normal. No congestion or rhinorrhea.     Right Turbinates: Not enlarged, swollen or pale.     Left Turbinates: Not enlarged, swollen or pale.     Right Sinus: No maxillary sinus tenderness or frontal sinus tenderness.     Left Sinus: No maxillary sinus tenderness or frontal sinus tenderness.      Mouth/Throat:     Lips: Pink.     Mouth: Mucous membranes are moist. No injury.     Tongue: No lesions.     Pharynx: Oropharynx is clear. Uvula midline. No pharyngeal swelling, oropharyngeal exudate, posterior oropharyngeal erythema or uvula swelling.     Tonsils: No tonsillar exudate or tonsillar abscesses.  Eyes:     General: Lids are normal. Lids are everted, no  foreign bodies appreciated. Vision grossly intact. Gaze aligned appropriately. No allergic shiner or visual field deficit.       Right eye: No discharge.        Left eye: No discharge.     Extraocular Movements: Extraocular movements intact.     Conjunctiva/sclera: Conjunctivae normal.     Right eye: Right conjunctiva is not injected. No exudate.    Left eye: Left conjunctiva is not injected. No exudate.    Pupils: Pupils are equal, round, and reactive to light.  Neck:     Thyroid: No thyroid mass, thyromegaly or thyroid tenderness.     Vascular: No carotid bruit.     Trachea: Trachea normal.  Cardiovascular:     Rate and Rhythm: Normal rate and regular rhythm.     Pulses: Normal pulses.          Carotid pulses are 2+ on the right side and 2+ on the left side.      Radial pulses are 2+ on the right side and 2+ on the left side.       Dorsalis pedis pulses are 2+ on the right side and 2+ on the left side.       Posterior tibial pulses are 2+ on the right side and 2+ on the left side.     Heart sounds: Normal heart sounds, S1 normal and S2 normal. No murmur heard.    No friction rub. No gallop.  Pulmonary:     Effort: Pulmonary effort is normal. No respiratory distress.     Breath sounds: Normal breath sounds and air entry. No stridor. No wheezing, rhonchi or rales.  Chest:     Chest wall: No tenderness.     Comments: Breasts: risk and benefit of breast self-exam was discussed, not examined. Upcoming mammogram next week.  Abdominal:     General: Abdomen is flat. Bowel sounds are normal. There is no distension.      Palpations: Abdomen is soft. There is no mass.     Tenderness: There is no abdominal tenderness. There is no right CVA tenderness, left CVA tenderness, guarding or rebound.     Hernia: No hernia is present.  Genitourinary:    Comments: Exam deferred; denies complaints Musculoskeletal:        General: No swelling, tenderness, deformity or signs of injury. Normal range of motion.     Cervical back: Full passive range of motion without pain, normal range of motion and neck supple. No edema, rigidity or tenderness. No muscular tenderness.     Right lower leg: No edema.     Left lower leg: No edema.  Lymphadenopathy:     Cervical: No cervical adenopathy.     Right cervical: No superficial, deep or posterior cervical adenopathy.    Left cervical: No superficial, deep or posterior cervical adenopathy.  Skin:    General: Skin is warm and dry.     Capillary Refill: Capillary refill takes less than 2 seconds.     Coloration: Skin is not jaundiced or pale.     Findings: No bruising, erythema, lesion or rash.  Neurological:     General: No focal deficit present.     Mental Status: She is alert and oriented to person, place, and time. Mental status is at baseline.     GCS: GCS eye subscore is 4. GCS verbal subscore is 5. GCS motor subscore is 6.     Sensory: Sensation is intact. No sensory deficit.     Motor:  Motor function is intact. No weakness.     Coordination: Coordination is intact. Coordination normal.     Gait: Gait is intact. Gait normal.  Psychiatric:        Attention and Perception: Attention and perception normal.        Mood and Affect: Mood and affect normal.        Speech: Speech normal.        Behavior: Behavior normal. Behavior is cooperative.        Thought Content: Thought content normal.        Cognition and Memory: Cognition and memory normal.        Judgment: Judgment normal.     Last depression screening scores    12/10/2021    8:11 AM 06/04/2021    9:20 AM 05/31/2020     8:22 AM  PHQ 2/9 Scores  PHQ - 2 Score 0 0 0  PHQ- 9 Score 0 0 0   Last fall risk screening    12/10/2021    8:10 AM  [place] in the past year? 0  Number falls in past yr: 0  Injury with Fall? 0  Risk for fall due to : No Fall Risks  Follow up Falls evaluation completed   Last Audit-C alcohol use screening    12/10/2021    8:11 AM  Alcohol Use Disorder Test (AUDIT)  1. How often do you have a drink containing alcohol? 0  2. How many drinks containing alcohol do you have on a typical day when you are drinking? 0  3. How often do you have six or more drinks on one occasion? 0  AUDIT-C Score 0   A score of 3 or more in women, and 4 or more in men indicates increased risk for alcohol abuse, EXCEPT if all of the points are from question 1   No results found for any visits on 12/10/21.  Assessment & Plan    Routine Health Maintenance and Physical Exam  Exercise Activities and Dietary recommendations  Goals      Exercise 150 minutes per week (moderate activity)        Immunization History  Administered Date(s) Administered   Td 11/20/2017   Tdap 07/14/2007    Health Maintenance  Topic Date Due   Diabetic kidney evaluation - GFR measurement  12/05/2021   Diabetic kidney evaluation - Urine ACR  12/05/2021   Fecal DNA (Cologuard)  12/05/2021   HEMOGLOBIN A1C  12/05/2021   COVID-19 Vaccine (1) 12/26/2021 (Originally 06/10/1956)   Zoster Vaccines- Shingrix (1 of 2) 03/11/2022 (Originally 12/10/2007)   FOOT EXAM  06/05/2022   OPHTHALMOLOGY EXAM  10/29/2022   MAMMOGRAM  12/13/2022   PAP SMEAR-Modifier  11/24/2023   DTaP/Tdap/Td (3 - Td or Tdap) 11/21/2027   Hepatitis C Screening  Completed   HPV VACCINES  Aged Out   HIV Screening  Discontinued    Discussed health benefits of physical activity, and encouraged her to engage in regular exercise appropriate for her age and condition.  Problem List Items Addressed This Visit       Cardiovascular and  Mediastinum   Hypertension associated with diabetes ([place])    BP elevated today; previously controlled with diet/exercise Goal <130/<80 Check CMP and CBC        Endocrine   Diabetes mellitus (HCC)    Chronic, with Metformin 1000 mg BID History of pressure calluses  Repeat A1C Request for more test strips  Relevant Medications   glucose blood (ONETOUCH VERIO) test strip   Other Relevant Orders   Urine Microalbumin w/creat. ratio   Hemoglobin A1c   Hyperlipidemia associated with type 2 diabetes mellitus (HCC)    Chronic, stable on Crestor 5 mg Repeat LP       Relevant Orders   Lipid panel     Other   Annual physical exam - Primary    UTD on dental and vision Has mammo scheduled next week Due for cologaurd- ordered Denies vaccines today Things to do to keep yourself healthy  - Exercise at least 30-45 minutes a day, 3-4 days a week.  - Eat a low-fat diet with lots of fruits and vegetables, up to 7-9 servings per day.  - Seatbelts can save your life. Wear them always.  - Smoke detectors on every level of your home, check batteries every year.  - Eye Doctor - have an eye exam every 1-2 years  - Safe sex - if you may be exposed to STDs, use a condom.  - Alcohol -  If you drink, do it moderately, less than 2 drinks per day.  - [place]. Choose someone to speak for you if you are not able.  - Depression is common in our stressful world.If you're feeling down or losing interest in things you normally enjoy, please come in for a visit.  - Violence - If anyone is threatening or hurting you, please call immediately.       Relevant Orders   Comprehensive Metabolic Panel (CMET)   Lipid panel   CBC   Screening for colon cancer   Relevant Orders   Cologuard   Return in about 6 months (around 06/11/2022) for chonic disease management.    Vonna Kotyk, FNP, have reviewed all documentation for this visit. The documentation on 12/10/21 for the exam,  diagnosis, procedures, and orders are all accurate and complete.  Gwyneth Sprout, [place] [phone] (phone) [phone] (fax)  [place]

## 2021-12-10 ENCOUNTER — Ambulatory Visit (INDEPENDENT_AMBULATORY_CARE_PROVIDER_SITE_OTHER): Payer: BC Managed Care – PPO | Admitting: Family Medicine

## 2021-12-10 ENCOUNTER — Encounter: Payer: Self-pay | Admitting: Family Medicine

## 2021-12-10 VITALS — BP 139/78 | HR 66 | Temp 97.7°F | Resp 16 | Ht 66.0 in | Wt 204.0 lb

## 2021-12-10 DIAGNOSIS — E1159 Type 2 diabetes mellitus with other circulatory complications: Secondary | ICD-10-CM

## 2021-12-10 DIAGNOSIS — I152 Hypertension secondary to endocrine disorders: Secondary | ICD-10-CM

## 2021-12-10 DIAGNOSIS — Z0000 Encounter for general adult medical examination without abnormal findings: Secondary | ICD-10-CM

## 2021-12-10 DIAGNOSIS — E1169 Type 2 diabetes mellitus with other specified complication: Secondary | ICD-10-CM | POA: Diagnosis not present

## 2021-12-10 DIAGNOSIS — Z1211 Encounter for screening for malignant neoplasm of colon: Secondary | ICD-10-CM | POA: Diagnosis not present

## 2021-12-10 DIAGNOSIS — E785 Hyperlipidemia, unspecified: Secondary | ICD-10-CM | POA: Diagnosis not present

## 2021-12-10 MED ORDER — ONETOUCH VERIO VI STRP: ORAL_STRIP | 4 refills | Status: DC

## 2021-12-10 NOTE — Assessment & Plan Note (Addendum)
UTD on dental and vision Has mammo scheduled next week Due for cologaurd- ordered Denies vaccines today Things to do to keep yourself healthy  - Exercise at least 30-45 minutes a day, 3-4 days a week.  - Eat a low-fat diet with lots of fruits and vegetables, up to 7-9 servings per day.  - Seatbelts can save your life. Wear them always.  - Smoke detectors on every level of your home, check batteries every year.  - Eye Doctor - have an eye exam every 1-2 years  - Safe sex - if you may be exposed to STDs, use a condom.  - Alcohol -  If you drink, do it moderately, less than 2 drinks per day.  - Health Care Power of Attorney. Choose someone to speak for you if you are not able.  - Depression is common in our stressful world.If you're feeling down or losing interest in things you normally enjoy, please come in for a visit.  - Violence - If anyone is threatening or hurting you, please call immediately.

## 2021-12-10 NOTE — Assessment & Plan Note (Signed)
BP elevated today; previously controlled with diet/exercise Goal <130/<80 Check CMP and CBC

## 2021-12-10 NOTE — Assessment & Plan Note (Addendum)
Chronic, with Metformin 1000 mg BID History of pressure calluses  Repeat A1C Request for more test strips

## 2021-12-10 NOTE — Assessment & Plan Note (Signed)
Chronic, stable on Crestor 5 mg Repeat LP

## 2021-12-11 ENCOUNTER — Other Ambulatory Visit: Payer: Self-pay | Admitting: Family Medicine

## 2021-12-11 MED ORDER — ROSUVASTATIN CALCIUM 20 MG PO TABS: 20.0000 mg | ORAL_TABLET | Freq: Every day | ORAL | 3 refills | Status: DC

## 2021-12-11 NOTE — Progress Notes (Signed)
Labs relatively stable; elevation in calcium has returned. Please stop any oral calcium supplements you might be taking. Recommend repeat in 6 months. Recommend secondary testing if remains elevated at that time. Recommend we increase Crestor to 20 mg.  Calculated heart attack and stroke risk remains elevated at 11% in 10 years.  The 10-year ASCVD risk score (Arnett DK, et al., 2019) is: 10.4%   Values used to calculate the score:     Age: 66 years     Sex: Female     Is Non-Hispanic African American: No     Diabetic: Yes     Tobacco smoker: No     Systolic Blood Pressure: 139 mmHg     Is BP treated: No     HDL Cholesterol: 63 mg/dL     Total Cholesterol: 199 mg/dL

## 2021-12-12 LAB — COMPREHENSIVE METABOLIC PANEL
ALT: 19 IU/L (ref 0–32)
AST: 24 IU/L (ref 0–40)
Albumin/Globulin Ratio: 1.9 (ref 1.2–2.2)
Albumin: 5 g/dL — ABNORMAL HIGH (ref 3.9–4.9)
Alkaline Phosphatase: 75 IU/L (ref 44–121)
BUN/Creatinine Ratio: 14 (ref 12–28)
BUN: 12 mg/dL (ref 8–27)
Bilirubin Total: 0.4 mg/dL (ref 0.0–1.2)
CO2: 24 mmol/L (ref 20–29)
Calcium: 10.8 mg/dL — ABNORMAL HIGH (ref 8.7–10.3)
Chloride: 97 mmol/L (ref 96–106)
Creatinine, Ser: 0.85 mg/dL (ref 0.57–1.00)
Globulin, Total: 2.6 g/dL (ref 1.5–4.5)
Glucose: 101 mg/dL — ABNORMAL HIGH (ref 70–99)
Potassium: 4.3 mmol/L (ref 3.5–5.2)
Sodium: 137 mmol/L (ref 134–144)
Total Protein: 7.6 g/dL (ref 6.0–8.5)
eGFR: 76 mL/min/{1.73_m2} (ref >59)

## 2021-12-12 LAB — LIPID PANEL
Chol/HDL Ratio: 3.2 ratio (ref 0.0–4.4)
Cholesterol, Total: 199 mg/dL (ref 100–199)
HDL: 63 mg/dL (ref >39)
LDL Chol Calc (NIH): 95 mg/dL (ref 0–99)
Triglycerides: 246 mg/dL — ABNORMAL HIGH (ref 0–149)
VLDL Cholesterol Cal: 41 mg/dL — ABNORMAL HIGH (ref 5–40)

## 2021-12-12 LAB — CBC
Hematocrit: 38.8 % (ref 34.0–46.6)
Hemoglobin: 12.7 g/dL (ref 11.1–15.9)
MCH: 29.2 pg (ref 26.6–33.0)
MCHC: 32.7 g/dL (ref 31.5–35.7)
MCV: 89 fL (ref 79–97)
Platelets: 372 10*3/uL (ref 150–450)
RBC: 4.35 x10E6/uL (ref 3.77–5.28)
RDW: 13.2 % (ref 11.7–15.4)
WBC: 8.1 10*3/uL (ref 3.4–10.8)

## 2021-12-12 LAB — MICROALBUMIN / CREATININE URINE RATIO
Creatinine, Urine: 47.8 mg/dL
Microalb/Creat Ratio: <6 mg/g creat (ref 0–29)
Microalbumin, Urine: <3 ug/mL

## 2021-12-12 LAB — HEMOGLOBIN A1C
Est. average glucose Bld gHb Est-mCnc: 131 mg/dL
Hgb A1c MFr Bld: 6.2 % — ABNORMAL HIGH (ref 4.8–5.6)

## 2021-12-17 ENCOUNTER — Ambulatory Visit
Admission: RE | Admit: 2021-12-17 | Discharge: 2021-12-17 | Disposition: A | Discharge status: Home / Self Care | Payer: BC Managed Care – PPO | Source: Ambulatory Visit | Attending: Family Medicine | Admitting: Family Medicine

## 2021-12-17 DIAGNOSIS — Z1231 Encounter for screening mammogram for malignant neoplasm of breast: Secondary | ICD-10-CM | POA: Diagnosis not present

## 2021-12-18 NOTE — Progress Notes (Signed)
Hi Jenan  Normal mammogram; repeat in 1 year.  Please let us know if you have any questions.  Thank you,  Hilde Churchman, FNP 

## 2021-12-24 LAB — COLOGUARD: COLOGUARD: NEGATIVE

## 2021-12-25 NOTE — Progress Notes (Signed)
Hi Ezrah,  Negative Cologuard.  Repeat colon cancer screening in 3 years.  Please let us know if you have any questions.  Thank you,  Merita Norton, FNP

## 2022-04-08 ENCOUNTER — Other Ambulatory Visit: Payer: Self-pay | Admitting: Family Medicine

## 2022-04-08 DIAGNOSIS — E119 Type 2 diabetes mellitus without complications: Secondary | ICD-10-CM

## 2022-06-18 ENCOUNTER — Ambulatory Visit: Payer: BC Managed Care – PPO | Admitting: Family Medicine

## 2022-07-05 ENCOUNTER — Other Ambulatory Visit: Payer: Self-pay | Admitting: Family Medicine

## 2022-07-05 DIAGNOSIS — E119 Type 2 diabetes mellitus without complications: Secondary | ICD-10-CM

## 2022-08-11 ENCOUNTER — Other Ambulatory Visit: Payer: Self-pay | Admitting: Family Medicine

## 2022-08-11 DIAGNOSIS — E1169 Type 2 diabetes mellitus with other specified complication: Secondary | ICD-10-CM

## 2022-08-12 NOTE — Telephone Encounter (Signed)
Requested Prescriptions  Pending Prescriptions Disp Refills   glucose blood (ONETOUCH VERIO) test strip [Pharmacy Med Name: OneTouch Verio In Vitro Strip] 100 each 4    Sig: USE 1 STRIP TO CHECK GLUCOSE TWICE DAILY     Endocrinology: Diabetes - Testing Supplies Passed - 08/11/2022  6:50 AM      Passed - Valid encounter within last 12 months    Recent Outpatient Visits           8 months ago Annual physical exam   [facility] Merita Norton T, FNP   1 year ago Type 2 diabetes mellitus with other specified complication, without long-term current use of insulin [facility])   [facility] [place] Jacky Kindle, FNP   1 year ago Annual physical exam   [facility] Merita Norton T, FNP   2 years ago Type 2 diabetes mellitus with other specified complication, without long-term current use of insulin [facility])    [facility] [place], Marzella Schlein, MD   2 years ago Annual physical exam   [facility] Joycelyn Man [place], [place]

## 2022-10-30 LAB — HM DIABETES EYE EXAM

## 2022-11-03 ENCOUNTER — Other Ambulatory Visit: Payer: Self-pay | Admitting: Family Medicine

## 2022-11-03 ENCOUNTER — Encounter: Payer: Self-pay | Admitting: Family Medicine

## 2022-11-03 ENCOUNTER — Telehealth: Payer: Self-pay

## 2022-11-03 DIAGNOSIS — Z1231 Encounter for screening mammogram for malignant neoplasm of breast: Secondary | ICD-10-CM

## 2022-11-03 NOTE — Telephone Encounter (Signed)
Copied from CRM [id number]. Topic: Referral - Status >> Nov 03, 2022  4:24 PM Franchot Heidelberg wrote: Reason for CRM: Regarding Diabetic Eye Exam, received a letter that the office has not match for the patient. Requesting a call back to clarify  Jana Half from [facility] [phone]

## 2022-11-04 NOTE — Telephone Encounter (Signed)
Not sure. We have the correct report scanned under media tab

## 2022-11-26 ENCOUNTER — Other Ambulatory Visit: Payer: Self-pay | Admitting: Family Medicine

## 2022-12-16 ENCOUNTER — Encounter: Payer: BC Managed Care – PPO | Admitting: Family Medicine

## 2022-12-19 ENCOUNTER — Ambulatory Visit
Admission: RE | Admit: 2022-12-19 | Discharge: 2022-12-19 | Disposition: A | Discharge status: Home / Self Care | Payer: Medicare PPO | Source: Ambulatory Visit | Attending: Family Medicine | Admitting: Family Medicine

## 2022-12-19 ENCOUNTER — Encounter: Payer: Self-pay | Admitting: Family Medicine

## 2022-12-19 ENCOUNTER — Ambulatory Visit (INDEPENDENT_AMBULATORY_CARE_PROVIDER_SITE_OTHER): Payer: Medicare PPO | Admitting: Family Medicine

## 2022-12-19 VITALS — BP 131/78 | HR 67 | Ht 66.0 in | Wt 205.0 lb

## 2022-12-19 DIAGNOSIS — Z0000 Encounter for general adult medical examination without abnormal findings: Secondary | ICD-10-CM

## 2022-12-19 DIAGNOSIS — Z792 Long term (current) use of antibiotics: Secondary | ICD-10-CM | POA: Insufficient documentation

## 2022-12-19 DIAGNOSIS — Z1231 Encounter for screening mammogram for malignant neoplasm of breast: Secondary | ICD-10-CM | POA: Diagnosis present

## 2022-12-19 DIAGNOSIS — I152 Hypertension secondary to endocrine disorders: Secondary | ICD-10-CM

## 2022-12-19 DIAGNOSIS — E1169 Type 2 diabetes mellitus with other specified complication: Secondary | ICD-10-CM | POA: Diagnosis not present

## 2022-12-19 DIAGNOSIS — E1159 Type 2 diabetes mellitus with other circulatory complications: Secondary | ICD-10-CM | POA: Diagnosis not present

## 2022-12-19 DIAGNOSIS — E785 Hyperlipidemia, unspecified: Secondary | ICD-10-CM

## 2022-12-19 MED ORDER — METFORMIN HCL 1000 MG PO TABS: 1000.0000 mg | ORAL_TABLET | Freq: Two times a day (BID) | ORAL | 3 refills | Status: DC

## 2022-12-19 MED ORDER — AMOXICILLIN 500 MG PO CAPS: ORAL_CAPSULE | ORAL | 0 refills | Status: DC

## 2022-12-19 MED ORDER — ROSUVASTATIN CALCIUM 20 MG PO TABS: 20.0000 mg | ORAL_TABLET | Freq: Every day | ORAL | 3 refills | Status: DC

## 2022-12-19 NOTE — Progress Notes (Unsigned)
Complete physical exam   Patient: Madison Mason   DOB: 09-14-1955   67 y.o. Female  MRN: [id number] Visit Date: 12/19/2022  Today's healthcare provider: Jacky Kindle, FNP  Introduced to nurse practitioner role and practice setting.  All questions answered.  Discussed provider/patient relationship and expectations.  Chief Complaint  Patient presents with   Annual Exam    Diet- general Exercise- walking daily for 30 minutes to one hour Feeling- well Sleeping- well Concerns- none   Immunizations    Pneumonia Vaccine - declined Shingles Vaccine - Advised to visit pharmacy   Subjective    Madison Mason is a 67 y.o. female who presents today for a complete physical exam.  She reports consuming a  following WW- got up to 215 4/24- down 10 lbs-  diet. The patient does not participate in regular exercise at present. She generally feels fairly well. She reports sleeping fairly well. She does have additional problems to discuss today.  HPI HPI     Annual Exam    Additional comments: Diet- general Exercise- walking daily for 30 minutes to one hour Feeling- well Sleeping- well Concerns- none        Immunizations    Additional comments: Pneumonia Vaccine - declined Shingles Vaccine - Advised to visit pharmacy      Last edited by Acey Lav, CMA on 12/19/2022  8:47 AM.      Past Medical History:  Diagnosis Date   Allergy 11/1981   Arthritis    Diabetes mellitus without complication (HCC) ?   GERD (gastroesophageal reflux disease)    Hyperlipidemia    Past Surgical History:  Procedure Laterality Date   CESAREAN SECTION  1989 and 1986   JOINT REPLACEMENT  03/16/2019 & 07/06/2019   hip replacement   Social History   Socioeconomic History   Marital status: Married    Spouse name: Not on file   Number of children: Not on file   Years of education: Not on file   Highest education level: Associate degree: occupational, Scientist, product/process development, or vocational program   Occupational History   Not on file  Tobacco Use   Smoking status: Former    Current packs/day: 0.00    Average packs/day: 1 pack/day for 25.0 years (25.0 ttl pk-yrs)    Types: Cigarettes    Start date: 01/06/1972    Quit date: 01/05/1997    Years since quitting: 25.9   Smokeless tobacco: Never   Tobacco comments:    Quit in 1988  Substance and Sexual Activity   Alcohol use: Not Currently    Comment: glass of wine 4 or 5 glasses in a year   Drug use: No   Sexual activity: Yes    Birth control/protection: None  Other Topics Concern   Not on file  Social History Narrative   Not on file   Social Drivers of Health   Financial Resource Strain: Low Risk  (12/15/2022)   Overall Financial Resource Strain (CARDIA)    Difficulty of Paying Living Expenses: Not hard at all  Food Insecurity: No Food Insecurity (12/15/2022)   Hunger Vital Sign    Worried About Running Out of Food in the Last Year: Never true    Ran Out of Food in the Last Year: Never true  Transportation Needs: No Transportation Needs (12/15/2022)   PRAPARE - Administrator, Civil Service (Medical): No    Lack of Transportation (Non-Medical): No  Physical Activity: Sufficiently Active (12/15/2022)  Exercise Vital Sign    Days of Exercise per Week: 7 days    Minutes of Exercise per Session: 30 min  Stress: No Stress Concern Present (12/15/2022)   [employer] of Occupational Health - Occupational Stress Questionnaire    Feeling of Stress : Not at all  Social Connections: Socially Integrated (12/15/2022)   Social Connection and Isolation Panel [NHANES]    Frequency of Communication with Friends and Family: More than three times a week    Frequency of Social Gatherings with Friends and Family: Twice a week    Attends Religious Services: More than 4 times per year    Active Member of [employer] or Organizations: Yes    Attends Banker Meetings: More than 4 times per year    Marital Status: Married   Catering manager Violence: Unknown (11/19/2016)   Humiliation, Afraid, Rape, and Kick questionnaire    Fear of Current or Ex-Partner: Patient declined    Emotionally Abused: Patient declined    Physically Abused: Patient declined    Sexually Abused: Patient declined   Family Status  Relation Name Status   Mother [place] Alive   Father [place] Alive   Sister  Alive   Sister  Alive   Sister  Alive   Mat Aunt  (Not Specified)   Youth worker  (Not Specified)  No partnership data on file   Family History  Problem Relation Age of Onset   Breast cancer Mother        102   Diabetes Mother    Cancer Father        lung   Heart disease Father    Breast cancer Sister        68   Healthy Sister    Healthy Sister    Healthy Sister    Breast cancer Maternal Aunt    Breast cancer Maternal Aunt    Allergies  Allergen Reactions   Sulfa Antibiotics Rash    Patient Care Team: Pardue, Monico Blitz, DO as PCP - General (Family Medicine)   Medications: Outpatient Medications Prior to Visit  Medication Sig   Blood Glucose Monitoring Suppl (ONETOUCH ULTRALINK) w/Device KIT To check blood sugar once daily.   cholecalciferol (VITAMIN D) 1000 UNITS tablet Take 1,000 Units by mouth daily.   famotidine (PEPCID) 10 MG tablet Take 10 mg by mouth daily.   glucose blood (ONETOUCH VERIO) test strip USE 1 STRIP TO CHECK GLUCOSE TWICE DAILY   Lancets (ONETOUCH DELICA PLUS LANCET33G) MISC USE LANCET TO CHECK BLOOD SUGAR ONCE DAILY   loratadine-pseudoephedrine (CLARITIN-D 12-HOUR) 5-120 MG tablet CLARITIN-D 12 HOUR, 5-120MG  (Oral Tablet Extended Release 12 Hour)  1 Every Day for 0 days  Quantity: 0.00;  Refills: 0   Ordered :21-Aug-2010  Kavin Leech ;  Started 13-Aug-2009 Active   Misc Natural Products (OSTEO BI-FLEX ADV TRIPLE ST) TABS Take 1 tablet by mouth daily.   MULTIPLE VITAMINS-MINERALS PO Take 1 tablet by mouth daily.    [DISCONTINUED] metFORMIN (GLUCOPHAGE) 1000 MG tablet Take 1 tablet (1,000 mg  total) by mouth 2 (two) times daily with a meal. Please schedule office visit before any future refill   [DISCONTINUED] rosuvastatin (CRESTOR) 20 MG tablet Take 1 tablet by mouth once daily   No facility-administered medications prior to visit.    Review of Systems Last CBC Lab Results  Component Value Date   WBC 8.1 12/10/2021   HGB 12.7 12/10/2021   HCT 38.8 12/10/2021   MCV 89 12/10/2021  MCH 29.2 12/10/2021   RDW 13.2 12/10/2021   PLT 372 12/10/2021   Last metabolic panel Lab Results  Component Value Date   GLUCOSE 101 (H) 12/10/2021   NA 137 12/10/2021   K 4.3 12/10/2021   CL 97 12/10/2021   CO2 24 12/10/2021   BUN 12 12/10/2021   CREATININE 0.85 12/10/2021   EGFR 76 12/10/2021   CALCIUM 10.8 (H) 12/10/2021   PROT 7.6 12/10/2021   ALBUMIN 5.0 (H) 12/10/2021   LABGLOB 2.6 12/10/2021   AGRATIO 1.9 12/10/2021   BILITOT 0.4 12/10/2021   ALKPHOS 75 12/10/2021   AST 24 12/10/2021   ALT 19 12/10/2021   Last lipids Lab Results  Component Value Date   CHOL 199 12/10/2021   HDL 63 12/10/2021   LDLCALC 95 12/10/2021   TRIG 246 (H) 12/10/2021   CHOLHDL 3.2 12/10/2021   Last hemoglobin A1c Lab Results  Component Value Date   HGBA1C 6.2 (H) 12/10/2021   Last thyroid functions Lab Results  Component Value Date   TSH 1.770 11/30/2019   Last vitamin D Lab Results  Component Value Date   VD25OH 41.4 11/30/2019   Last vitamin B12 and Folate No results found for: "VITAMINB12", "FOLATE"   {See past labs  Heme  Chem  Endocrine  Serology  Results Review (optional):1}  Objective    BP 131/78 (BP Location: Right Arm, Patient Position: Sitting, Cuff Size: Normal)   Pulse 67   Ht 5\' 6"  (1.676 m)   Wt 205 lb (93 kg)   BMI 33.09 kg/m   BP Readings from Last 3 Encounters:  12/19/22 131/78  12/10/21 139/78  06/04/21 125/83   Wt Readings from Last 3 Encounters:  12/19/22 205 lb (93 kg)  12/10/21 204 lb (92.5 kg)  06/04/21 201 lb 1.6 oz (91.2 kg)   SpO2  Readings from Last 3 Encounters:  12/10/21 100%  06/04/21 100%  12/05/20 100%   Physical Exam Vitals and nursing note reviewed.  Constitutional:      General: She is awake. She is not in acute distress.    Appearance: Normal appearance. She is well-developed and well-groomed. She is obese. She is not ill-appearing, toxic-appearing or diaphoretic.  HENT:     Head: Normocephalic and atraumatic.     Jaw: There is normal jaw occlusion. No trismus, tenderness, swelling or pain on movement.     Right Ear: Hearing, tympanic membrane, ear canal and external ear normal. There is no impacted cerumen.     Left Ear: Hearing, tympanic membrane, ear canal and external ear normal. There is no impacted cerumen.     Nose: Nose normal. No congestion or rhinorrhea.     Right Turbinates: Not enlarged, swollen or pale.     Left Turbinates: Not enlarged, swollen or pale.     Right Sinus: No maxillary sinus tenderness or frontal sinus tenderness.     Left Sinus: No maxillary sinus tenderness or frontal sinus tenderness.     Mouth/Throat:     Lips: Pink.     Mouth: Mucous membranes are moist. No injury.     Tongue: No lesions.     Pharynx: Oropharynx is clear. Uvula midline. No pharyngeal swelling, oropharyngeal exudate, posterior oropharyngeal erythema or uvula swelling.     Tonsils: No tonsillar exudate or tonsillar abscesses.  Eyes:     General: Lids are normal. Lids are everted, no foreign bodies appreciated. Vision grossly intact. Gaze aligned appropriately. No allergic shiner or visual field deficit.  Right eye: No discharge.        Left eye: No discharge.     Extraocular Movements: Extraocular movements intact.     Conjunctiva/sclera: Conjunctivae normal.     Right eye: Right conjunctiva is not injected. No exudate.    Left eye: Left conjunctiva is not injected. No exudate.    Pupils: Pupils are equal, round, and reactive to light.  Neck:     Thyroid: No thyroid mass, thyromegaly or thyroid  tenderness.     Vascular: No carotid bruit.     Trachea: Trachea normal.  Cardiovascular:     Rate and Rhythm: Normal rate and regular rhythm.     Pulses: Normal pulses.          Carotid pulses are 2+ on the right side and 2+ on the left side.      Radial pulses are 2+ on the right side and 2+ on the left side.       Dorsalis pedis pulses are 2+ on the right side and 2+ on the left side.       Posterior tibial pulses are 2+ on the right side and 2+ on the left side.     Heart sounds: Normal heart sounds, S1 normal and S2 normal. No murmur heard.    No friction rub. No gallop.  Pulmonary:     Effort: Pulmonary effort is normal. No respiratory distress.     Breath sounds: Normal breath sounds and air entry. No stridor. No wheezing, rhonchi or rales.  Chest:     Chest wall: No tenderness.  Abdominal:     General: Abdomen is flat. Bowel sounds are normal. There is no distension.     Palpations: Abdomen is soft. There is no mass.     Tenderness: There is no abdominal tenderness. There is no right CVA tenderness, left CVA tenderness, guarding or rebound.     Hernia: No hernia is present.  Genitourinary:    Comments: Exam deferred; denies complaints Musculoskeletal:        General: No swelling, tenderness, deformity or signs of injury. Normal range of motion.     Cervical back: Full passive range of motion without pain, normal range of motion and neck supple. No edema, rigidity or tenderness. No muscular tenderness.     Right lower leg: No edema.     Left lower leg: No edema.  Lymphadenopathy:     Cervical: No cervical adenopathy.     Right cervical: No superficial, deep or posterior cervical adenopathy.    Left cervical: No superficial, deep or posterior cervical adenopathy.  Skin:    General: Skin is warm and dry.     Capillary Refill: Capillary refill takes less than 2 seconds.     Coloration: Skin is not jaundiced or pale.     Findings: No bruising, erythema, lesion or rash.   Neurological:     General: No focal deficit present.     Mental Status: She is alert and oriented to person, place, and time. Mental status is at baseline.     GCS: GCS eye subscore is 4. GCS verbal subscore is 5. GCS motor subscore is 6.     Cranial Nerves: No cranial nerve deficit.     Sensory: Sensation is intact. No sensory deficit.     Motor: Motor function is intact. No weakness.     Coordination: Coordination is intact. Coordination normal.     Gait: Gait is intact. Gait normal.  Psychiatric:  Attention and Perception: Attention and perception normal.        Mood and Affect: Mood and affect normal.        Speech: Speech normal.        Behavior: Behavior normal. Behavior is cooperative.        Thought Content: Thought content normal.        Cognition and Memory: Cognition and memory normal.        Judgment: Judgment normal.     Last depression screening scores    12/19/2022    9:00 AM 12/10/2021    8:11 AM 06/04/2021    9:20 AM  PHQ 2/9 Scores  PHQ - 2 Score  0 0  PHQ- 9 Score  0 0  Exception Documentation Patient refusal     Last fall risk screening    12/19/2022    9:26 AM  Fall Risk   Falls in the past year? 0  Number falls in past yr: 0  Injury with Fall? 0   Last Audit-C alcohol use screening    12/15/2022    7:51 PM  Alcohol Use Disorder Test (AUDIT)  1. How often do you have a drink containing alcohol? 0   2. How many drinks containing alcohol do you have on a typical day when you are drinking? 0   3. How often do you have six or more drinks on one occasion? 0   AUDIT-C Score 0      Patient-reported   A score of 3 or more in women, and 4 or more in men indicates increased risk for alcohol abuse, EXCEPT if all of the points are from question 1   No results found for any visits on 12/19/22.  Assessment & Plan    Routine Health Maintenance and Physical Exam  Exercise Activities and Dietary recommendations  Goals      Exercise 150 minutes per  week (moderate activity)        Immunization History  Administered Date(s) Administered   Td 11/20/2017   Tdap 07/14/2007    Health Maintenance  Topic Date Due   Medicare Annual Wellness (AWV)  Never done   Pneumonia Vaccine 59+ Years old (1 of 2 - PCV) Never done   Zoster Vaccines- Shingrix (1 of 2) Never done   FOOT EXAM  06/05/2022   HEMOGLOBIN A1C  06/11/2022   COVID-19 Vaccine (1 - 2024-25 season) Never done   DEXA SCAN  Never done   Diabetic kidney evaluation - eGFR measurement  12/11/2022   Diabetic kidney evaluation - Urine ACR  12/11/2022   OPHTHALMOLOGY EXAM  10/30/2023   Cervical Cancer Screening (HPV/Pap Cotest)  11/24/2023   MAMMOGRAM  12/18/2023   Fecal DNA (Cologuard)  12/16/2024   DTaP/Tdap/Td (3 - Td or Tdap) 11/21/2027   Hepatitis C Screening  Completed   HPV VACCINES  Aged Out   HIV Screening  Discontinued    Discussed health benefits of physical activity, and encouraged her to engage in regular exercise appropriate for her age and condition.  Problem List Items Addressed This Visit       Cardiovascular and Mediastinum   Hypertension associated with diabetes (HCC)   Relevant Medications   metFORMIN (GLUCOPHAGE) 1000 MG tablet   rosuvastatin (CRESTOR) 20 MG tablet   Other Relevant Orders   Comprehensive metabolic panel   CBC with Differential/Platelet   TSH     Endocrine   Diabetes mellitus (HCC) - Primary   Relevant Medications   metFORMIN (GLUCOPHAGE)  1000 MG tablet   rosuvastatin (CRESTOR) 20 MG tablet   Other Relevant Orders   Comprehensive metabolic panel   Hemoglobin A1c   Urine Microalbumin w/creat. ratio   Vitamin D (25 hydroxy)   Hyperlipidemia associated with type 2 diabetes mellitus (HCC)   Relevant Medications   metFORMIN (GLUCOPHAGE) 1000 MG tablet   rosuvastatin (CRESTOR) 20 MG tablet   Other Relevant Orders   Lipid Panel With LDL/HDL Ratio   Lipid panel     Other   Annual physical exam   Other Visit Diagnoses        Need for antibiotic prophylaxis for dental procedure       Relevant Medications   amoxicillin (AMOXIL) 500 MG capsule      Return in about 6 months (around 06/19/2023) for T2DM management.    Leilani Merl, FNP, have reviewed all documentation for this visit. The documentation on 12/19/22 for the exam, diagnosis, procedures, and orders are all accurate and complete.  Jacky Kindle, FNP  [facility] [phone] (phone) [phone] (fax)  [facility]

## 2022-12-21 ENCOUNTER — Other Ambulatory Visit: Payer: Self-pay | Admitting: Family Medicine

## 2022-12-21 ENCOUNTER — Encounter: Payer: Self-pay | Admitting: Family Medicine

## 2022-12-21 LAB — COMPREHENSIVE METABOLIC PANEL
ALT: 14 [IU]/L (ref 0–32)
AST: 20 [IU]/L (ref 0–40)
Albumin: 4.9 g/dL (ref 3.9–4.9)
Alkaline Phosphatase: 70 [IU]/L (ref 44–121)
BUN/Creatinine Ratio: 14 (ref 12–28)
BUN: 12 mg/dL (ref 8–27)
Bilirubin Total: 0.3 mg/dL (ref 0.0–1.2)
CO2: 24 mmol/L (ref 20–29)
Calcium: 10.3 mg/dL (ref 8.7–10.3)
Chloride: 99 mmol/L (ref 96–106)
Creatinine, Ser: 0.87 mg/dL (ref 0.57–1.00)
Globulin, Total: 2.7 g/dL (ref 1.5–4.5)
Glucose: 128 mg/dL — ABNORMAL HIGH (ref 70–99)
Potassium: 5 mmol/L (ref 3.5–5.2)
Sodium: 138 mmol/L (ref 134–144)
Total Protein: 7.6 g/dL (ref 6.0–8.5)
eGFR: 74 mL/min/{1.73_m2} (ref >59)

## 2022-12-21 LAB — CBC WITH DIFFERENTIAL/PLATELET
Basophils Absolute: 0.1 10*3/uL (ref 0.0–0.2)
Basos: 1 %
EOS (ABSOLUTE): 0.3 10*3/uL (ref 0.0–0.4)
Eos: 4 %
Hematocrit: 39.1 % (ref 34.0–46.6)
Hemoglobin: 12.6 g/dL (ref 11.1–15.9)
Immature Grans (Abs): 0 10*3/uL (ref 0.0–0.1)
Immature Granulocytes: 0 %
Lymphocytes Absolute: 3.3 10*3/uL — ABNORMAL HIGH (ref 0.7–3.1)
Lymphs: 41 %
MCH: 29.4 pg (ref 26.6–33.0)
MCHC: 32.2 g/dL (ref 31.5–35.7)
MCV: 91 fL (ref 79–97)
Monocytes Absolute: 0.5 10*3/uL (ref 0.1–0.9)
Monocytes: 7 %
Neutrophils Absolute: 3.8 10*3/uL (ref 1.4–7.0)
Neutrophils: 47 %
Platelets: 363 10*3/uL (ref 150–450)
RBC: 4.28 x10E6/uL (ref 3.77–5.28)
RDW: 12.9 % (ref 11.7–15.4)
WBC: 8.1 10*3/uL (ref 3.4–10.8)

## 2022-12-21 LAB — HEMOGLOBIN A1C
Est. average glucose Bld gHb Est-mCnc: 148 mg/dL
Hgb A1c MFr Bld: 6.8 % — ABNORMAL HIGH (ref 4.8–5.6)

## 2022-12-21 LAB — LIPID PANEL
Chol/HDL Ratio: 2.7 {ratio} (ref 0.0–4.4)
Cholesterol, Total: 167 mg/dL (ref 100–199)
HDL: 63 mg/dL (ref >39)
LDL Chol Calc (NIH): 74 mg/dL (ref 0–99)
Triglycerides: 179 mg/dL — ABNORMAL HIGH (ref 0–149)
VLDL Cholesterol Cal: 30 mg/dL (ref 5–40)

## 2022-12-21 LAB — MICROALBUMIN / CREATININE URINE RATIO
Creatinine, Urine: 67.5 mg/dL
Microalb/Creat Ratio: 6 mg/g{creat} (ref 0–29)
Microalbumin, Urine: 3.8 ug/mL

## 2022-12-21 LAB — TSH: TSH: 1.41 u[IU]/mL (ref 0.450–4.500)

## 2022-12-21 LAB — LIPID PANEL WITH LDL/HDL RATIO: LDL/HDL Ratio: 1.2 {ratio} (ref 0.0–3.2)

## 2022-12-21 LAB — VITAMIN D 25 HYDROXY (VIT D DEFICIENCY, FRACTURES): Vit D, 25-Hydroxy: 38.8 ng/mL (ref 30.0–100.0)

## 2022-12-21 MED ORDER — TIRZEPATIDE 5 MG/0.5ML ~~LOC~~ SOAJ
5.0000 mg | SUBCUTANEOUS | 0 refills | Status: DC
Start: 2022-12-21 — End: 2022-12-25

## 2022-12-21 MED ORDER — LISINOPRIL-HYDROCHLOROTHIAZIDE 20-12.5 MG PO TABS: 1.0000 | ORAL_TABLET | Freq: Every day | ORAL | 3 refills | Status: DC

## 2022-12-21 NOTE — Progress Notes (Signed)
Cholesterol shows improved total, improved good/HDL, improved VLDL bad, improved LDL. Trigs/fats are also improved but remain elevated. The 10-year ASCVD risk score (Arnett DK, et al., 2019) is: 9.5% I continue to recommend diet low in saturated fat and regular exercise - 30 min at least 5 times per week  Diabetes has returned; Continue to recommend balanced, lower carb meals. Smaller meal size, adding snacks. Choosing water as drink of choice and increasing purposeful exercise. If desired- recommend start of mounjaro 5 mg to assist with DM and weight, heart and neuro health.

## 2022-12-21 NOTE — Assessment & Plan Note (Signed)
Request for additional abx for dental procedures

## 2022-12-21 NOTE — Assessment & Plan Note (Signed)
Chronic, previously stable Remains on 1000 mg BID Continue to recommend balanced, lower carb meals. Smaller meal size, adding snacks. Choosing water as drink of choice and increasing purposeful exercise.

## 2022-12-21 NOTE — Assessment & Plan Note (Signed)
Chronic, borderline Goal remains 119/79 Previously controlled by diet- recommend start of Zestoretic

## 2022-12-21 NOTE — Assessment & Plan Note (Signed)
Chronic, previously elevated LDL goal <70 recommend diet low in saturated fat and regular exercise - 30 min at least 5 times per week Previously taking crestor 20

## 2022-12-21 NOTE — Assessment & Plan Note (Signed)

## 2022-12-25 ENCOUNTER — Other Ambulatory Visit: Payer: Self-pay | Admitting: Family Medicine

## 2022-12-25 MED ORDER — TIRZEPATIDE 5 MG/0.5ML ~~LOC~~ SOAJ: 5.0000 mg | SUBCUTANEOUS | 0 refills | Status: DC

## 2023-06-19 ENCOUNTER — Ambulatory Visit: Payer: Self-pay | Admitting: Family Medicine

## 2023-06-19 ENCOUNTER — Encounter: Payer: Self-pay | Admitting: Family Medicine

## 2023-06-19 VITALS — BP 109/58 | HR 64 | Resp 14 | Wt 190.9 lb

## 2023-06-19 DIAGNOSIS — Z7985 Long-term (current) use of injectable non-insulin antidiabetic drugs: Secondary | ICD-10-CM

## 2023-06-19 DIAGNOSIS — Z79899 Other long term (current) drug therapy: Secondary | ICD-10-CM

## 2023-06-19 DIAGNOSIS — E1122 Type 2 diabetes mellitus with diabetic chronic kidney disease: Secondary | ICD-10-CM | POA: Diagnosis not present

## 2023-06-19 DIAGNOSIS — E1159 Type 2 diabetes mellitus with other circulatory complications: Secondary | ICD-10-CM

## 2023-06-19 DIAGNOSIS — E785 Hyperlipidemia, unspecified: Secondary | ICD-10-CM

## 2023-06-19 DIAGNOSIS — N182 Chronic kidney disease, stage 2 (mild): Secondary | ICD-10-CM | POA: Diagnosis not present

## 2023-06-19 DIAGNOSIS — L989 Disorder of the skin and subcutaneous tissue, unspecified: Secondary | ICD-10-CM

## 2023-06-19 DIAGNOSIS — E1169 Type 2 diabetes mellitus with other specified complication: Secondary | ICD-10-CM | POA: Diagnosis not present

## 2023-06-19 DIAGNOSIS — R21 Rash and other nonspecific skin eruption: Secondary | ICD-10-CM

## 2023-06-19 DIAGNOSIS — I152 Hypertension secondary to endocrine disorders: Secondary | ICD-10-CM

## 2023-06-19 MED ORDER — TIRZEPATIDE 7.5 MG/0.5ML ~~LOC~~ SOAJ: 7.5000 mg | SUBCUTANEOUS | 2 refills | Status: DC

## 2023-06-19 MED ORDER — BLOOD GLUCOSE MONITORING SUPPL DEVI: 1.0000 | Freq: Every day | 0 refills | Status: AC

## 2023-06-19 MED ORDER — BLOOD GLUCOSE TEST VI STRP
1.0000 | ORAL_STRIP | Freq: Every day | 3 refills | Status: AC
Start: 2023-06-19 — End: ?

## 2023-06-19 NOTE — Progress Notes (Unsigned)
 Established patient visit   Patient: Madison Mason   DOB: [birth year]   68 y.o. Female  MRN: [id number] Visit Date: 06/19/2023  Today's healthcare provider: Carlean Charter, DO   Chief Complaint  Patient presents with   Follow-up    Would like a new brand of test strips, meter. Her insurance does not cover current one   Subjective    HPI Last annual: 12/19/2022   Madison Mason is a 68 year old female with hypertension and diabetes who presents for follow-up on blood pressure and diabetes management.  She manages her diabetes with Mounjaro , with blood sugar levels mostly in the 80s and 90s, occasionally reaching 113 and 119. Her medication will last only another week, and she is uncertain if the current dose is adequate.  She has been following a Weight Watchers program since April of the previous year, resulting in a weight loss of about 10 pounds, with a total loss of 15 pounds since December. Her diet includes oatmeal blueberry muffins for breakfast, a [place] sandwich with low-calorie bread for lunch, and grilled chicken or low-fat hamburger for dinner. She often does not use all her Weight Watchers points. She exercises by walking or riding her exercise bike for about 70 minutes a day.  She has a history of psoriasis and mentions a wound on her foot. Due to previous hip replacements, she has difficulty seeing the bottom of her feet. She has not visited a podiatrist in years but is open to doing so.  She has not had any known vitamin deficiencies and is currently on metformin . She has not received the shingles or pneumonia vaccines and does not get the COVID or flu vaccines, citing past experiences of getting sick after flu shots.  ***   FOOT - Dr. Lara Plants   - planned OTC cream pt preference ***  {History (Optional):23778}  Medications: Outpatient Medications Prior to Visit  Medication Sig   amoxicillin  (AMOXIL ) 500 MG capsule Take 2000 mg- 4 capsules PO prior to  dental work and/or cleaning   Blood Glucose Monitoring Suppl (ONETOUCH ULTRALINK) w/Device KIT To check blood sugar once daily.   cholecalciferol (VITAMIN D ) 1000 UNITS tablet Take 1,000 Units by mouth daily.   glucose blood (ONETOUCH VERIO) test strip USE 1 STRIP TO CHECK GLUCOSE TWICE DAILY   Lancets (ONETOUCH DELICA PLUS LANCET33G) MISC USE LANCET TO CHECK BLOOD SUGAR ONCE DAILY   lisinopril -hydrochlorothiazide  (ZESTORETIC ) 20-12.5 MG tablet Take 1 tablet by mouth daily.   metFORMIN  (GLUCOPHAGE ) 1000 MG tablet Take 1 tablet (1,000 mg total) by mouth 2 (two) times daily with a meal.   Misc Natural Products (OSTEO BI-FLEX ADV TRIPLE ST) TABS Take 1 tablet by mouth daily.   MULTIPLE VITAMINS-MINERALS PO Take 1 tablet by mouth daily.    rosuvastatin  (CRESTOR ) 20 MG tablet Take 1 tablet (20 mg total) by mouth daily.   tirzepatide  (MOUNJARO ) 5 MG/0.5ML Pen Inject 5 mg into the skin once a week.   famotidine (PEPCID) 10 MG tablet Take 10 mg by mouth daily.   loratadine-pseudoephedrine (CLARITIN-D 12-HOUR) 5-120 MG tablet CLARITIN-D 12 HOUR, 5-120MG  (Oral Tablet Extended Release 12 Hour)  1 Every Day for 0 days  Quantity: 0.00;  Refills: 0   Ordered :21-Aug-2010  Winford Haus ;  Started 13-Aug-2009 Active   No facility-administered medications prior to visit.    Review of Systems ***  {Insert previous labs (optional):23779} {See past labs  Heme  Chem  Endocrine  Serology  Results Review (optional):1}   Objective    BP (!) 109/58 (BP Location: Left Arm, Patient Position: Sitting, Cuff Size: Large)   Pulse 64   Resp 14   Wt 190 lb 14.4 oz (86.6 kg)   SpO2 100%   BMI 30.81 kg/m  {Insert last BP/Wt (optional):23777}{See vitals history (optional):1}   Physical Exam Constitutional:      Appearance: Normal appearance.  HENT:     Head: Normocephalic and atraumatic.   Eyes:     General: No scleral icterus.    Conjunctiva/sclera: Conjunctivae normal.    Cardiovascular:      Pulses:          Dorsalis pedis pulses are 2+ on the right side and 2+ on the left side.       Posterior tibial pulses are 2+ on the right side and 2+ on the left side.   Musculoskeletal:     Right foot: Normal range of motion. No deformity, bunion, Charcot foot, foot drop or prominent metatarsal heads.     Left foot: Normal range of motion. No deformity, bunion, Charcot foot, foot drop or prominent metatarsal heads.       Feet:  Feet:     Right foot:     Protective Sensation: 10 sites tested.  10 sites sensed.     Skin integrity: No ulcer, blister, skin breakdown, erythema, warmth, callus, dry skin or fissure.     Toenail Condition: Right toenails are normal.     Left foot:     Protective Sensation: 10 sites tested.  10 sites sensed.     Skin integrity: Skin breakdown present. No ulcer, blister, erythema, warmth, callus, dry skin or fissure.     Toenail Condition: Left toenails are normal.     Comments: White lesion with pinpoint black center  Neurological:     Mental Status: She is alert and oriented to person, place, and time. Mental status is at baseline.   Psychiatric:        Mood and Affect: Mood normal.        Behavior: Behavior normal.      No results found for any visits on 06/19/23.  Assessment & Plan    Type 2 diabetes mellitus with stage 2 chronic kidney disease, without long-term current use of insulin (HCC)  Hyperlipidemia associated with type 2 diabetes mellitus (HCC)  Hypertension associated with diabetes (HCC)     Type 2 Diabetes Mellitus Blood sugars stable in 80s-90s, occasional >100. A1c previously concerning. Mounjaro  dose increase considered for weight loss and glycemic control. Low hypoglycemia risk, advised symptom monitoring. - Increase Mounjaro  to 7.5 mg. - Monitor blood sugar levels regularly. - Order A1c test. - Educate on hypoglycemia symptoms.  Hypertension Follow-up on blood pressure management.  Foot Lesion Lesion on foot suggests  athlete's foot, wound present. Difficulty seeing feet due to hip replacement. Open to OTC treatment and podiatry referral. - Refer to podiatry. - Use OTC athlete's foot cream.  General Health Maintenance Hesitant about vaccines. Bone density screening deferred to next year. - Recommend shingles vaccine at pharmacy. - Discuss pneumonia vaccine. - Defer bone density screening to next year.  Follow-up Scheduled for follow-up in six months. Medicare visit requirements discussed. - Schedule follow-up in six months. - Plan for Medicare visit next appointment. ***  No follow-ups on file.      I discussed the assessment and treatment plan with the patient  The patient was provided an opportunity to ask questions and all were  answered. The patient agreed with the plan and demonstrated an understanding of the instructions.   The patient was advised to call back or seek an in-person evaluation if the symptoms worsen or if the condition fails to improve as anticipated.    Carlean Charter, DO  [facility] [phone] (phone) [phone] (fax)  [facility]

## 2023-06-19 NOTE — Patient Instructions (Signed)
 Recommended vaccines: Shingrix (shingles), Prevnar-20 (pneumonia)

## 2023-06-21 LAB — COMPREHENSIVE METABOLIC PANEL WITH GFR
ALT: 16 IU/L (ref 0–32)
AST: 20 IU/L (ref 0–40)
Albumin: 4.4 g/dL (ref 3.9–4.9)
Alkaline Phosphatase: 78 IU/L (ref 44–121)
BUN/Creatinine Ratio: 13 (ref 12–28)
BUN: 11 mg/dL (ref 8–27)
Bilirubin Total: 0.3 mg/dL (ref 0.0–1.2)
CO2: 24 mmol/L (ref 20–29)
Calcium: 10.2 mg/dL (ref 8.7–10.3)
Chloride: 87 mmol/L — ABNORMAL LOW (ref 96–106)
Creatinine, Ser: 0.87 mg/dL (ref 0.57–1.00)
Globulin, Total: 2.4 g/dL (ref 1.5–4.5)
Glucose: 101 mg/dL — ABNORMAL HIGH (ref 70–99)
Potassium: 4.8 mmol/L (ref 3.5–5.2)
Sodium: 128 mmol/L — ABNORMAL LOW (ref 134–144)
Total Protein: 6.8 g/dL (ref 6.0–8.5)
eGFR: 74 mL/min/{1.73_m2} (ref >59)

## 2023-06-21 LAB — LIPID PANEL
Chol/HDL Ratio: 2.8 ratio (ref 0.0–4.4)
Cholesterol, Total: 132 mg/dL (ref 100–199)
HDL: 48 mg/dL (ref >39)
LDL Chol Calc (NIH): 62 mg/dL (ref 0–99)
Triglycerides: 125 mg/dL (ref 0–149)
VLDL Cholesterol Cal: 22 mg/dL (ref 5–40)

## 2023-06-21 LAB — MICROALBUMIN / CREATININE URINE RATIO
Creatinine, Urine: 44.8 mg/dL
Microalb/Creat Ratio: 11 mg/g{creat} (ref 0–29)
Microalbumin, Urine: 5.1 ug/mL

## 2023-06-21 LAB — HEMOGLOBIN A1C
Est. average glucose Bld gHb Est-mCnc: 117 mg/dL
Hgb A1c MFr Bld: 5.7 % — ABNORMAL HIGH (ref 4.8–5.6)

## 2023-06-21 LAB — VITAMIN B12: Vitamin B-12: 454 pg/mL (ref 232–1245)

## 2023-06-23 ENCOUNTER — Ambulatory Visit: Payer: Self-pay | Admitting: Family Medicine

## 2023-06-23 ENCOUNTER — Encounter: Payer: Self-pay | Admitting: Family Medicine

## 2023-06-23 DIAGNOSIS — E871 Hypo-osmolality and hyponatremia: Secondary | ICD-10-CM

## 2023-06-30 ENCOUNTER — Ambulatory Visit: Admitting: Podiatry

## 2023-06-30 DIAGNOSIS — B999 Unspecified infectious disease: Secondary | ICD-10-CM

## 2023-06-30 MED ORDER — TERBINAFINE HCL 250 MG PO TABS: 250.0000 mg | ORAL_TABLET | Freq: Every day | ORAL | 0 refills | Status: DC

## 2023-06-30 MED ORDER — DOXYCYCLINE HYCLATE 100 MG PO TABS: 100.0000 mg | ORAL_TABLET | Freq: Two times a day (BID) | ORAL | 0 refills | Status: DC

## 2023-06-30 MED ORDER — CLOTRIMAZOLE-BETAMETHASONE 1-0.05 % EX CREA: 1.0000 | TOPICAL_CREAM | Freq: Every day | CUTANEOUS | 0 refills | Status: DC

## 2023-06-30 NOTE — Progress Notes (Signed)
 Subjective:  Patient ID: Madison Mason, female    DOB: [birth year],  MRN: [id number]  Chief Complaint  Patient presents with   Tinea Pedis    68 y.o. female presents with the above complaint.  Patient presents with complaint of skin epidermolysis with some pus jewels is forming.  She states she is a diabetic this has been on for quite some time she has been living with his athlete's foot for a while now she would like to discuss treatment options she has not tried any oral medication she has tried some topical medication which has not helped.  Denies any other acute complaints 5 out of 10 pain scale.  Mostly itching noted.  Denies any liver issues   Review of Systems: Negative except as noted in the HPI. Denies N/V/F/Ch.  Past Medical History:  Diagnosis Date   Allergy 11/1981   Arthritis    Diabetes mellitus without complication (HCC) ?   GERD (gastroesophageal reflux disease)    Hyperlipidemia     Current Outpatient Medications:    clotrimazole-betamethasone (LOTRISONE) cream, Apply 1 Application topically daily., Disp: 30 g, Rfl: 0   doxycycline (VIBRA-TABS) 100 MG tablet, Take 1 tablet (100 mg total) by mouth 2 (two) times daily., Disp: 28 tablet, Rfl: 0   terbinafine (LAMISIL) 250 MG tablet, Take 1 tablet (250 mg total) by mouth daily., Disp: 30 tablet, Rfl: 0   Blood Glucose Monitoring Suppl DEVI, 1 each by Does not apply route daily before breakfast. May substitute to any manufacturer covered by patient's insurance., Disp: 1 each, Rfl: 0   cholecalciferol (VITAMIN D ) 1000 UNITS tablet, Take 1,000 Units by mouth daily., Disp: , Rfl:    Glucose Blood (BLOOD GLUCOSE TEST STRIPS) STRP, 1 each by In Vitro route daily before breakfast. May substitute to any manufacturer covered by patient's insurance., Disp: 100 strip, Rfl: 3   Lancets (ONETOUCH DELICA PLUS LANCET33G) MISC, USE LANCET TO CHECK BLOOD SUGAR ONCE DAILY, Disp: 100 each, Rfl: 2   lisinopril -hydrochlorothiazide   (ZESTORETIC ) 20-12.5 MG tablet, Take 1 tablet by mouth daily., Disp: 90 tablet, Rfl: 3   metFORMIN  (GLUCOPHAGE ) 1000 MG tablet, Take 1 tablet (1,000 mg total) by mouth 2 (two) times daily with a meal., Disp: 180 tablet, Rfl: 3   Misc Natural Products (OSTEO BI-FLEX ADV TRIPLE ST) TABS, Take 1 tablet by mouth daily., Disp: , Rfl:    MULTIPLE VITAMINS-MINERALS PO, Take 1 tablet by mouth daily. , Disp: , Rfl:    rosuvastatin  (CRESTOR ) 20 MG tablet, Take 1 tablet (20 mg total) by mouth daily., Disp: 90 tablet, Rfl: 3   tirzepatide  (MOUNJARO ) 7.5 MG/0.5ML Pen, Inject 7.5 mg into the skin once a week., Disp: 6 mL, Rfl: 2  Social History   Tobacco Use  Smoking Status Former   Current packs/day: 0.00   Average packs/day: 1 pack/day for 25.0 years (25.0 ttl pk-yrs)   Types: Cigarettes   Start date: 01/06/1972   Quit date: 01/05/1997   Years since quitting: 26.4  Smokeless Tobacco Never  Tobacco Comments   Quit in 1988    Allergies  Allergen Reactions   Sulfa Antibiotics Rash   Objective:  There were no vitals filed for this visit. There is no height or weight on file to calculate BMI. Constitutional Well developed. Well nourished.  Vascular Dorsalis pedis pulses palpable bilaterally. Posterior tibial pulses palpable bilaterally. Capillary refill normal to all digits.  No cyanosis or clubbing noted. Pedal hair growth normal.  Neurologic Normal speech. Oriented to  person, place, and time. Epicritic sensation to light touch grossly present bilaterally.  Dermatologic Left plantar to medial foot skin epidermolysis noted with subjective component of itching pustules noted.  Superficial.  No erythema noted.  No malodor present.  Orthopedic: Normal joint ROM without pain or crepitus bilaterally. No visible deformities. No bony tenderness.   Radiographs: None Assessment:   1. Superinfection    Plan:  Patient was evaluated and treated and all questions answered.  Left foot  superinfection with underlying athlete's foot - All questions and concerns were discussed with the patient in extensive detail given the amount of superinfection that is present patient will benefit from 14 days of doxycycline and 30 days of Lamisil and topical application of Lotrisone cream.  Lotrisone cream was sent to the pharmacy. - All medications were sent to the pharmacy -  No follow-ups on file.

## 2023-07-21 ENCOUNTER — Telehealth: Payer: Self-pay | Admitting: Podiatry

## 2023-07-21 NOTE — Telephone Encounter (Signed)
 As per patient is experiencing allergic reaction to medication Terbinafine  (Lamisil ) 250 MG Tablet, Patient stated she took Benadryl due to itching. Patient would like to know if she should stop taking medication

## 2023-08-11 ENCOUNTER — Ambulatory Visit: Admitting: Podiatry

## 2023-08-11 DIAGNOSIS — B0802 Orf virus disease: Secondary | ICD-10-CM | POA: Diagnosis not present

## 2023-08-11 DIAGNOSIS — L989 Disorder of the skin and subcutaneous tissue, unspecified: Secondary | ICD-10-CM | POA: Diagnosis not present

## 2023-08-11 DIAGNOSIS — B999 Unspecified infectious disease: Secondary | ICD-10-CM

## 2023-08-11 MED ORDER — CLOBETASOL PROPIONATE 0.05 % EX CREA: 1.0000 | TOPICAL_CREAM | Freq: Two times a day (BID) | CUTANEOUS | 0 refills | Status: DC

## 2023-08-11 NOTE — Progress Notes (Signed)
 Subjective:  Patient ID: Madison Mason, female    DOB: 1955/07/29,  MRN: [id number]  Chief Complaint  Patient presents with   Superinfection    Pt stated that she is doing better no concerns     68 y.o. female presents with the above complaint.  Presents for follow-up of skin epidermolysis with pustules.  At this time she states is about the same has not had much improvement she would like to discuss next treatment plan.   Review of Systems: Negative except as noted in the HPI. Denies N/V/F/Ch.  Past Medical History:  Diagnosis Date   Allergy 11/1981   Arthritis    Diabetes mellitus without complication (HCC) ?   GERD (gastroesophageal reflux disease)    Hyperlipidemia     Current Outpatient Medications:    clobetasol  cream (TEMOVATE ) 0.05 %, Apply 1 Application topically 2 (two) times daily., Disp: 30 g, Rfl: 0   Blood Glucose Monitoring Suppl DEVI, 1 each by Does not apply route daily before breakfast. May substitute to any manufacturer covered by patient's insurance., Disp: 1 each, Rfl: 0   cholecalciferol (VITAMIN D ) 1000 UNITS tablet, Take 1,000 Units by mouth daily., Disp: , Rfl:    clotrimazole -betamethasone  (LOTRISONE ) cream, Apply 1 Application topically daily., Disp: 30 g, Rfl: 0   doxycycline  (VIBRA -TABS) 100 MG tablet, Take 1 tablet (100 mg total) by mouth 2 (two) times daily., Disp: 28 tablet, Rfl: 0   Glucose Blood (BLOOD GLUCOSE TEST STRIPS) STRP, 1 each by In Vitro route daily before breakfast. May substitute to any manufacturer covered by patient's insurance., Disp: 100 strip, Rfl: 3   Lancets (ONETOUCH DELICA PLUS LANCET33G) MISC, USE LANCET TO CHECK BLOOD SUGAR ONCE DAILY, Disp: 100 each, Rfl: 2   lisinopril -hydrochlorothiazide  (ZESTORETIC ) 20-12.5 MG tablet, Take 1 tablet by mouth daily., Disp: 90 tablet, Rfl: 3   metFORMIN  (GLUCOPHAGE ) 1000 MG tablet, Take 1 tablet (1,000 mg total) by mouth 2 (two) times daily with a meal., Disp: 180 tablet, Rfl: 3   Misc  Natural Products (OSTEO BI-FLEX ADV TRIPLE ST) TABS, Take 1 tablet by mouth daily., Disp: , Rfl:    MULTIPLE VITAMINS-MINERALS PO, Take 1 tablet by mouth daily. , Disp: , Rfl:    rosuvastatin  (CRESTOR ) 20 MG tablet, Take 1 tablet (20 mg total) by mouth daily., Disp: 90 tablet, Rfl: 3   terbinafine  (LAMISIL ) 250 MG tablet, Take 1 tablet (250 mg total) by mouth daily., Disp: 30 tablet, Rfl: 0   tirzepatide  (MOUNJARO ) 7.5 MG/0.5ML Pen, Inject 7.5 mg into the skin once a week., Disp: 6 mL, Rfl: 2  Social History   Tobacco Use  Smoking Status Former   Current packs/day: 0.00   Average packs/day: 1 pack/day for 25.0 years (25.0 ttl pk-yrs)   Types: Cigarettes   Start date: 01/06/1972   Quit date: 01/05/1997   Years since quitting: 26.6  Smokeless Tobacco Never  Tobacco Comments   Quit in 1988    Allergies  Allergen Reactions   Sulfa Antibiotics Rash   Objective:  There were no vitals filed for this visit. There is no height or weight on file to calculate BMI. Constitutional Well developed. Well nourished.  Vascular Dorsalis pedis pulses palpable bilaterally. Posterior tibial pulses palpable bilaterally. Capillary refill normal to all digits.  No cyanosis or clubbing noted. Pedal hair growth normal.  Neurologic Normal speech. Oriented to person, place, and time. Epicritic sensation to light touch grossly present bilaterally.  Dermatologic Left plantar to medial foot skin epidermolysis noted  with subjective component of itching pustules noted.  Superficial.  No erythema noted.  No malodor present.  Orthopedic: Normal joint ROM without pain or crepitus bilaterally. No visible deformities. No bony tenderness.   Radiographs: None Assessment:   1. Superinfection   2. Benign skin lesion    Plan:  Patient was evaluated and treated and all questions answered.  Left foot superinfection with underlying athlete's foot - Clinically patient has had no improvement.  She still has  pustules.  She was able to tolerate most of the medication completed to the course.  She did develop some reaction to Lamisil  2 was at 3 weeks mark.  She stopped the medication afterwards - At this time patient will benefit from punch biopsy of the lesion.  Patient is agreement would like to proceed with procedure -Excision of the skin lesion Skin was prepped in standard technique with Betadine.  One-to-one mixture of 1% lidocaine plain half percent Marcaine plain was injected circumferentially in V-block fashion 3 cc.  3 mm punch biopsy was utilized to remove the lesion in its entirety down to the level of the subcutaneous tissue.  The punch biopsy site was sent to the pharmacy the wound site was dressed with triple antibiotic 4 x 4 gauze Kerlix and Ace bandage.  I have asked the patient to keep it covered with Neosporin and a Band-Aid until healing.  I will see her back again in 6 weeks   No follow-ups on file.

## 2023-08-27 ENCOUNTER — Ambulatory Visit: Payer: Self-pay | Admitting: Podiatry

## 2023-08-27 MED ORDER — CLOBETASOL PROPIONATE 0.05 % EX CREA: 1.0000 | TOPICAL_CREAM | Freq: Two times a day (BID) | CUTANEOUS | 0 refills | Status: DC

## 2023-08-27 NOTE — Addendum Note (Signed)
 Addended by: Jamaar Howes on: 08/27/2023 08:05 AM   Modules accepted: Orders

## 2023-08-31 ENCOUNTER — Ambulatory Visit

## 2023-08-31 DIAGNOSIS — L403 Pustulosis palmaris et plantaris: Secondary | ICD-10-CM | POA: Diagnosis not present

## 2023-08-31 MED ORDER — CALCIPOTRIENE 0.005 % EX CREA: TOPICAL_CREAM | CUTANEOUS | 1 refills | Status: DC

## 2023-08-31 NOTE — Progress Notes (Signed)
   New Patient Visit   Subjective  Madison Mason is a 68 y.o. 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  Podiatrist referred patient here.    The following portions of the chart were reviewed this encounter and updated as appropriate: medications, allergies, medical history  Review of Systems:  No other skin or systemic complaints except as noted in HPI or Assessment and Plan.  Objective  Well appearing patient in no apparent distress; mood and affect are within normal limits.  A focused examination was performed of the following areas:  L plantar foot with pink scaly plaque. Some hemorrhagic papules.  Relevant exam findings are noted in the Assessment and Plan.         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 Condition is symptomatic/ bothersome to patient. Not currently at goal.   Psoriasis is a chronic non-curable, but treatable genetic/hereditary disease that may have other systemic features affecting other organ systems such as joints (Psoriatic Arthritis). It is associated with an increased risk of inflammatory bowel disease, heart disease, non-alcoholic fatty liver disease, and depression.  Treatments include light and laser treatments; topical medications; and systemic medications including oral and injectables.  Treatment Plan: Discussed treatment options topical cream, oral tablets or biologics  Start Calcipotriene  cream apply to affected psoriasis areas twice a day  Continue Clobetasol  cream bid  Continue goldbond psoriasis sal acid cream  May consider Otezla vs other biologic in the future but  patient prefers to hold on systemic tx for now     Return in about 6 weeks (around 10/12/2023) for Psoriasis.  IFay Kirks, CMA, am acting as scribe for Lauraine JAYSON Kanaris, MD .   Documentation: I have reviewed the above documentation for accuracy and completeness, and I agree with the above.  Lauraine JAYSON Kanaris, MD

## 2023-08-31 NOTE — Patient Instructions (Addendum)
 May consider Otezla (apremilast) tablets      Due to recent changes in healthcare laws, you may see results of your pathology and/or laboratory studies on MyChart before the doctors have had a chance to review them. We understand that in some cases there may be results that are confusing or concerning to you. Please understand that not all results are received at the same time and often the doctors may need to interpret multiple results in order to provide you with the best plan of care or course of treatment. Therefore, we ask that you please give us  2 business days to thoroughly review all your results before contacting the office for clarification. Should we see a critical lab result, you will be contacted sooner.   If You Need Anything After Your Visit  If you have any questions or concerns for your doctor, please call our main line at [phone] and press option 4 to reach your doctor's medical assistant. If no one answers, please leave a voicemail as directed and we will return your call as soon as possible. Messages left after 4 pm will be answered the following business day.   You may also send us  a message via MyChart. We typically respond to MyChart messages within 1-2 business days.  For prescription refills, please ask your pharmacy to contact our office. Our fax number is [phone].  If you have an urgent issue when the clinic is closed that cannot wait until the next business day, you can page your doctor at the number below.    Please note that while we do our best to be available for urgent issues outside of office hours, we are not available 24/7.   If you have an urgent issue and are unable to reach us , you may choose to seek medical care at your doctor's office, retail clinic, urgent care center, or emergency room.  If you have a medical emergency, please immediately call 911 or go to the emergency department.  Pager Numbers  - Dr. Hester: [phone]  - Dr.  Jackquline: [phone]  - Dr. Claudene: [phone]   - Dr. Raymund: [phone]  In the event of inclement weather, please call our main line at [phone] for an update on the status of any delays or closures.  Dermatology Medication Tips: Please keep the boxes that topical medications come in in order to help keep track of the instructions about where and how to use these. Pharmacies typically print the medication instructions only on the boxes and not directly on the medication tubes.   If your medication is too expensive, please contact our office at [phone] option 4 or send us  a message through MyChart.   We are unable to tell what your co-pay for medications will be in advance as this is different depending on your insurance coverage. However, we may be able to find a substitute medication at lower cost or fill out paperwork to get insurance to cover a needed medication.   If a prior authorization is required to get your medication covered by your insurance company, please allow us  1-2 business days to complete this process.  Drug prices often vary depending on where the prescription is filled and some pharmacies may offer cheaper prices.  The website www.goodrx.com contains coupons for medications through different pharmacies. The prices here do not account for what the cost may be with help from insurance (it may be cheaper with your insurance), but the website can give you the price if you did  not use any insurance.  - You can print the associated coupon and take it with your prescription to the pharmacy.  - You may also stop by our office during regular business hours and pick up a GoodRx coupon card.  - If you need your prescription sent electronically to a different pharmacy, notify our office through [facility] or by phone at [phone] option 4.     Si Usted Necesita Algo Despus de Su Visita  Tambin puede enviarnos un mensaje a travs de Clinical cytogeneticist. Por lo  general respondemos a los mensajes de MyChart en el transcurso de 1 a 2 das hbiles.  Para renovar recetas, por favor pida a su farmacia que se ponga en contacto con nuestra oficina. Randi lakes de fax es [place] [phone].  Si tiene un asunto urgente cuando la clnica est cerrada y que no puede esperar hasta el siguiente da hbil, puede llamar/localizar a su doctor(a) al nmero que aparece a continuacin.   Por favor, tenga en cuenta que aunque hacemos todo lo posible para estar disponibles para asuntos urgentes fuera del horario de [place], no estamos disponibles las 24 horas del da, los 7 [address]  [address] de la [place].   Si tiene un problema urgente y no puede comunicarse con nosotros, puede optar por buscar atencin mdica  en el consultorio de su doctor(a), en una clnica privada, en un centro de atencin urgente o en una sala de emergencias.  Si tiene Engineer, drilling, por favor llame inmediatamente al 911 o vaya a la sala de emergencias.  Nmeros de bper  - Dr. Hester: [phone]  - Dra. Jackquline: [phone]  - Dr. Claudene: [phone]  - Dra. Kitts: [phone]  En caso de inclemencias del [place], por favor llame a nuestra lnea principal al [phone] para una actualizacin sobre el estado de cualquier retraso o cierre.  Consejos para la medicacin en dermatologa: Por favor, guarde las cajas en las que vienen los medicamentos de uso tpico para ayudarle a seguir las instrucciones sobre dnde y cmo usarlos. Las farmacias generalmente imprimen las instrucciones del medicamento slo en las cajas y no directamente en los tubos del [employer].   Si su medicamento es muy caro, por favor, pngase en contacto con landry rieger llamando al [phone] y presione la opcin 4 o envenos un mensaje a travs de Clinical cytogeneticist.   No podemos decirle cul ser su copago por los medicamentos por adelantado ya que esto es diferente dependiendo de la cobertura de su seguro. Sin embargo, es  posible que podamos encontrar un medicamento sustituto a Audiological scientist un formulario para que el seguro cubra el medicamento que se considera necesario.   Si se requiere una autorizacin previa para que su compaa de seguros malta su medicamento, por favor permtanos de 1 a 2 das hbiles para completar este proceso.  Los precios de los medicamentos varan con frecuencia dependiendo del Environmental consultant de dnde se surte la receta y alguna farmacias pueden ofrecer precios ms baratos.  El sitio web www.goodrx.com tiene cupones para medicamentos de Health and safety inspector. Los precios aqu no tienen en cuenta lo que podra costar con la ayuda del seguro (puede ser ms barato con su seguro), pero el sitio web puede darle el precio si no utiliz Tourist information centre manager.  - Puede imprimir el cupn correspondiente y llevarlo con su receta a la farmacia.  - Tambin puede pasar por nuestra oficina durante el horario de atencin regular y Education officer, museum una tarjeta de cupones de GoodRx.  - Si necesita  que su receta se enve electrnicamente a Psychiatrist, informe a nuestra oficina a travs de MyChart de [place] o por telfono llamando al [phone] y presione la opcin 4.

## 2023-10-19 ENCOUNTER — Ambulatory Visit

## 2023-10-19 DIAGNOSIS — L403 Pustulosis palmaris et plantaris: Secondary | ICD-10-CM | POA: Diagnosis not present

## 2023-10-19 MED ORDER — CLOBETASOL PROPIONATE 0.05 % EX CREA: 1.0000 | TOPICAL_CREAM | Freq: Two times a day (BID) | CUTANEOUS | 1 refills | Status: AC

## 2023-10-19 MED ORDER — CALCIPOTRIENE 0.005 % EX CREA: TOPICAL_CREAM | CUTANEOUS | 1 refills | Status: DC

## 2023-10-19 NOTE — Patient Instructions (Addendum)
 Look for a cream containing urea 40% to help smooth out the skin. Brands include Eucerin Roughness relief spot treatment (drugstores), PurSources ([employer]), and Urea40 ([facility]), Bare40 ([employer]), Eucerin roughness relief     Due to recent changes in healthcare laws, you may see results of your pathology and/or laboratory studies on MyChart before the doctors have had a chance to review them. We understand that in some cases there may be results that are confusing or concerning to you. Please understand that not all results are received at the same time and often the doctors may need to interpret multiple results in order to provide you with the best plan of care or course of treatment. Therefore, we ask that you please give us  2 business days to thoroughly review all your results before contacting the office for clarification. Should we see a critical lab result, you will be contacted sooner.   If You Need Anything After Your Visit  If you have any questions or concerns for your doctor, please call our main line at [phone] and press option 4 to reach your doctor's medical assistant. If no one answers, please leave a voicemail as directed and we will return your call as soon as possible. Messages left after 4 pm will be answered the following business day.   You may also send us  a message via MyChart. We typically respond to MyChart messages within 1-2 business days.  For prescription refills, please ask your pharmacy to contact our office. Our fax number is [phone].  If you have an urgent issue when the clinic is closed that cannot wait until the next business day, you can page your doctor at the number below.    Please note that while we do our best to be available for urgent issues outside of office hours, we are not available 24/7.   If you have an urgent issue and are unable to reach us , you may choose to seek medical care at your doctor's office, retail clinic, urgent care center, or  emergency room.  If you have a medical emergency, please immediately call 911 or go to the emergency department.  Pager Numbers  - Dr. Hester: [phone]  - Dr. Jackquline: [phone]  - Dr. Claudene: [phone]   - Dr. Raymund: [phone]  In the event of inclement weather, please call our main line at [phone] for an update on the status of any delays or closures.  Dermatology Medication Tips: Please keep the boxes that topical medications come in in order to help keep track of the instructions about where and how to use these. Pharmacies typically print the medication instructions only on the boxes and not directly on the medication tubes.   If your medication is too expensive, please contact our office at [phone] option 4 or send us  a message through MyChart.   We are unable to tell what your co-pay for medications will be in advance as this is different depending on your insurance coverage. However, we may be able to find a substitute medication at lower cost or fill out paperwork to get insurance to cover a needed medication.   If a prior authorization is required to get your medication covered by your insurance company, please allow us  1-2 business days to complete this process.  Drug prices often vary depending on where the prescription is filled and some pharmacies may offer cheaper prices.  The website www.goodrx.com contains coupons for medications through different pharmacies. The prices here do not account for what the  cost may be with help from insurance (it may be cheaper with your insurance), but the website can give you the price if you did not use any insurance.  - You can print the associated coupon and take it with your prescription to the pharmacy.  - You may also stop by our office during regular business hours and pick up a GoodRx coupon card.  - If you need your prescription sent electronically to a different pharmacy, notify our office through [facility] or by phone at [phone] option 4.     Si Usted Necesita Algo Despus de Su Visita  Tambin puede enviarnos un mensaje a travs de Clinical cytogeneticist. Por lo general respondemos a los mensajes de MyChart en el transcurso de 1 a 2 das hbiles.  Para renovar recetas, por favor pida a su farmacia que se ponga en contacto con nuestra oficina. Randi lakes de fax es [place] [phone].  Si tiene un asunto urgente cuando la clnica est cerrada y que no puede esperar hasta el siguiente da hbil, puede llamar/localizar a su doctor(a) al nmero que aparece a continuacin.   Por favor, tenga en cuenta que aunque hacemos todo lo posible para estar disponibles para asuntos urgentes fuera del horario de [place], no estamos disponibles las 24 horas del da, los 7 [address]  [address] de la [place].   Si tiene un problema urgente y no puede comunicarse con nosotros, puede optar por buscar atencin mdica  en el consultorio de su doctor(a), en una clnica privada, en un centro de atencin urgente o en una sala de emergencias.  Si tiene Engineer, drilling, por favor llame inmediatamente al 911 o vaya a la sala de emergencias.  Nmeros de bper  - Dr. Hester: [phone]  - Dra. Jackquline: [phone]  - Dr. Claudene: [phone]  - Dra. Kitts: [phone]  En caso de inclemencias del [place], por favor llame a nuestra lnea principal al [phone] para una actualizacin sobre el estado de cualquier retraso o cierre.  Consejos para la medicacin en dermatologa: Por favor, guarde las cajas en las que vienen los medicamentos de uso tpico para ayudarle a seguir las instrucciones sobre dnde y cmo usarlos. Las farmacias generalmente imprimen las instrucciones del medicamento slo en las cajas y no directamente en los tubos del [place].   Si su medicamento es muy caro, por favor, pngase en contacto con landry rieger llamando al [phone] y presione la opcin 4 o envenos un mensaje a travs de  Clinical cytogeneticist.   No podemos decirle cul ser su copago por los medicamentos por adelantado ya que esto es diferente dependiendo de la cobertura de su seguro. Sin embargo, es posible que podamos encontrar un medicamento sustituto a Audiological scientist un formulario para que el seguro cubra el medicamento que se considera necesario.   Si se requiere una autorizacin previa para que su compaa de seguros malta su medicamento, por favor permtanos de 1 a 2 das hbiles para completar este proceso.  Los precios de los medicamentos varan con frecuencia dependiendo del Environmental consultant de dnde se surte la receta y alguna farmacias pueden ofrecer precios ms baratos.  El sitio web www.goodrx.com tiene cupones para medicamentos de Health and safety inspector. Los precios aqu no tienen en cuenta lo que podra costar con la ayuda del seguro (puede ser ms barato con su seguro), pero el sitio web puede darle el precio si no utiliz Tourist information centre manager.  - Puede imprimir el cupn correspondiente y llevarlo con su receta a la farmacia.  -  Tambin puede pasar por nuestra oficina durante el horario de atencin regular y Education officer, museum una tarjeta de cupones de GoodRx.  - Si necesita que su receta se enve electrnicamente a una farmacia diferente, informe a nuestra oficina a travs de MyChart de [place] o por telfono llamando al [phone] y presione la opcin 4.

## 2023-10-19 NOTE — Progress Notes (Signed)
    Subjective   Madison Mason is a 68 y.o. 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 Patient is established patient .  Today patient reports: Patient states left foot is a little better, she is using clobetasol  cream twice daily. She states that calcipotriene  was not covered. She has [place] Psoriasis, uses occasionally. Not itchy or sore.   Review of Systems:    No other skin or systemic complaints except as noted in HPI or Assessment and Plan.  The following portions of the chart were reviewed this encounter and updated as appropriate: medications, allergies, medical history  Relevant Medical History:  n/a   Objective  Well appearing patient in no apparent distress; mood and affect are within normal limits. Examination was performed of the: Focused Exam of: left foot   Examination notable for:  pink scaly plaque at the left foot Examination limited by: Clothing     Assessment & Plan   PALMOPLANTAR PUSTULAR PSORIASIS of L foot - improving on clobetasol  but not at goal  Start Calcipotriene  cream apply to affected psoriasis areas twice a day.  Continue Clobetasol  cream bid.  Start Urea 40% cream daily. Patient prefers to continue with topicals. Defers systemic treatment for now.   Chronic and persistent condition with duration or expected duration over one year. Condition is symptomatic and bothersome to patient. Patient is flaring and not currently at treatment goal.   Procedures, orders, diagnosis for this visit:    There are no diagnoses linked to this encounter.  Return to clinic: Return in about 4 months (around 02/19/2024) for Psoriasis.  LILLETTE Andrea Kerns, CMA, am acting as scribe for Lauraine JAYSON Kanaris, MD .   Documentation: I have reviewed the above documentation for accuracy and completeness, and I agree with the above.  Lauraine JAYSON Kanaris, MD

## 2023-10-20 ENCOUNTER — Other Ambulatory Visit: Payer: Self-pay | Admitting: Family Medicine

## 2023-10-20 DIAGNOSIS — Z1231 Encounter for screening mammogram for malignant neoplasm of breast: Secondary | ICD-10-CM

## 2023-10-23 ENCOUNTER — Other Ambulatory Visit: Payer: Self-pay

## 2023-11-02 LAB — OPHTHALMOLOGY REPORT-SCANNED

## 2023-12-07 ENCOUNTER — Telehealth: Payer: Self-pay | Admitting: Family Medicine

## 2023-12-07 NOTE — Telephone Encounter (Signed)
 [facility] faxed refill request for the following medications:   metFORMIN  (GLUCOPHAGE ) 1000 MG tablet     rosuvastatin  (CRESTOR ) 20 MG tablet    lisinopril -hydrochlorothiazide  (ZESTORETIC ) 20-12.5 MG tablet    Please advise.

## 2023-12-08 ENCOUNTER — Other Ambulatory Visit: Payer: Self-pay

## 2023-12-08 DIAGNOSIS — E1169 Type 2 diabetes mellitus with other specified complication: Secondary | ICD-10-CM

## 2023-12-08 MED ORDER — METFORMIN HCL 1000 MG PO TABS: 1000.0000 mg | ORAL_TABLET | Freq: Two times a day (BID) | ORAL | 0 refills | Status: DC

## 2023-12-08 MED ORDER — LISINOPRIL-HYDROCHLOROTHIAZIDE 20-12.5 MG PO TABS: 1.0000 | ORAL_TABLET | Freq: Every day | ORAL | 0 refills | Status: DC

## 2023-12-08 MED ORDER — ROSUVASTATIN CALCIUM 20 MG PO TABS: 20.0000 mg | ORAL_TABLET | Freq: Every day | ORAL | 0 refills | Status: DC

## 2023-12-08 NOTE — Telephone Encounter (Signed)
 Converted to rx request

## 2023-12-18 ENCOUNTER — Encounter: Admitting: Family Medicine

## 2023-12-21 ENCOUNTER — Inpatient Hospital Stay: Admission: RE | Admit: 2023-12-21 | Discharge: 2023-12-21 | Attending: Family Medicine | Admitting: Family Medicine

## 2023-12-21 DIAGNOSIS — Z1231 Encounter for screening mammogram for malignant neoplasm of breast: Secondary | ICD-10-CM

## 2023-12-28 ENCOUNTER — Ambulatory Visit: Admitting: Family Medicine

## 2023-12-28 ENCOUNTER — Encounter: Payer: Self-pay | Admitting: Family Medicine

## 2023-12-28 ENCOUNTER — Ambulatory Visit: Payer: Self-pay | Admitting: Family Medicine

## 2023-12-28 VITALS — BP 111/61 | HR 75 | Temp 97.7°F | Ht 66.0 in | Wt 184.7 lb

## 2023-12-28 DIAGNOSIS — E785 Hyperlipidemia, unspecified: Secondary | ICD-10-CM

## 2023-12-28 DIAGNOSIS — N182 Chronic kidney disease, stage 2 (mild): Secondary | ICD-10-CM | POA: Diagnosis not present

## 2023-12-28 DIAGNOSIS — E1169 Type 2 diabetes mellitus with other specified complication: Secondary | ICD-10-CM | POA: Diagnosis not present

## 2023-12-28 DIAGNOSIS — E871 Hypo-osmolality and hyponatremia: Secondary | ICD-10-CM | POA: Diagnosis not present

## 2023-12-28 DIAGNOSIS — Z7985 Long-term (current) use of injectable non-insulin antidiabetic drugs: Secondary | ICD-10-CM

## 2023-12-28 DIAGNOSIS — E1122 Type 2 diabetes mellitus with diabetic chronic kidney disease: Secondary | ICD-10-CM | POA: Diagnosis not present

## 2023-12-28 DIAGNOSIS — Z0001 Encounter for general adult medical examination with abnormal findings: Secondary | ICD-10-CM | POA: Diagnosis not present

## 2023-12-28 DIAGNOSIS — E1159 Type 2 diabetes mellitus with other circulatory complications: Secondary | ICD-10-CM | POA: Diagnosis not present

## 2023-12-28 DIAGNOSIS — I152 Hypertension secondary to endocrine disorders: Secondary | ICD-10-CM | POA: Diagnosis not present

## 2023-12-28 DIAGNOSIS — Z0000 Encounter for general adult medical examination without abnormal findings: Secondary | ICD-10-CM

## 2023-12-28 MED ORDER — ROSUVASTATIN CALCIUM 20 MG PO TABS: 20.0000 mg | ORAL_TABLET | Freq: Every day | ORAL | 1 refills | Status: AC

## 2023-12-28 MED ORDER — LISINOPRIL-HYDROCHLOROTHIAZIDE 20-12.5 MG PO TABS: 1.0000 | ORAL_TABLET | Freq: Every day | ORAL | 1 refills | Status: AC

## 2023-12-28 MED ORDER — METFORMIN HCL 1000 MG PO TABS: 1000.0000 mg | ORAL_TABLET | Freq: Two times a day (BID) | ORAL | 1 refills | Status: AC

## 2023-12-28 NOTE — Progress Notes (Signed)
 "  Chief Complaint  Patient presents with   Medicare Wellness    Patient is here today for a Medicare annual wellness visit, expresses no concerns.     Subjective:   Madison Mason is a 68 y.o. female who presents for a Medicare Annual Wellness Visit.   Madison Mason is a 68 year old female who presents for an annual wellness visit.  She monitors her blood sugar levels daily, with readings typically in the high 80s to low 100s, occasionally dropping to the 70s. She notes higher blood sugar levels after consuming salads with ingredients like tomatoes, cucumbers, carrots, and sugar-free dressing. She is on Weight Watchers and avoids high carbohydrate foods, including bread.  She experiences weight fluctuations, ranging from 186 to 180 pounds. Since starting Mounjaro , she is mindful of her fat intake due to gastrointestinal side effects such as gas and belching. Her diet consists of low-fat options like 93% lean hamburgers and chicken, and she seldom eats steak.  She engages in regular physical activity, including walking and using an exercise bike. She experiences restless legs at night, which prompts her to ride her bike to alleviate discomfort.  She has not received the COVID booster, having only taken the initial vaccine. She has not yet received the shingles vaccine due to concerns about potential flu-like symptoms post-vaccination.  She reports no chest pain, shortness of breath, or abnormal heartbeats. She reports no problems with vision, hearing, decision-making, memory, mobility, or performing daily activities independently.    Visit info / Clinical Intake: Medicare Wellness Visit Type:: Subsequent Annual Wellness Visit Persons participating in visit and providing information:: patient Medicare Wellness Visit Mode:: In-person (required for WTM) Interpreter Needed?: No Pre-visit prep was completed: no AWV questionnaire completed by patient prior to visit?: no Living  arrangements:: lives with spouse/significant other Patient's Overall Health Status Rating: good Typical amount of pain: none Does pain affect daily life?: no Are you currently prescribed opioids?: no  Dietary Habits and Nutritional Risks How many meals a day?: 3 Eats fruit and vegetables daily?: yes Most meals are obtained by: preparing own meals In the last 2 weeks, have you had any of the following?: none Diabetic:: (!) yes Any non-healing wounds?: no How often do you check your BS?: 1 Would you like to be referred to a Nutritionist or for Diabetic Management? : no  Functional Status Activities of Daily Living (to include ambulation/medication): Independent Ambulation: Independent Medication Administration: Independent Home Management (perform basic housework or laundry): Independent Manage your own finances?: yes Primary transportation is: driving Concerns about vision?: no *vision screening is required for WTM* Concerns about hearing?: no  Fall Screening Falls in the past year?: 0 Number of falls in past year: 0 Was there an injury with Fall?: 0 Fall Risk Category Calculator: 0 Patient Fall Risk Level: Low Fall Risk  Fall Risk Patient at Risk for Falls Due to: No Fall Risks  Home and Transportation Safety: All rugs have non-skid backing?: yes All stairs or steps have railings?: yes Grab bars in the bathtub or shower?: (!) no Have non-skid surface in bathtub or shower?: yes Good home lighting?: yes Regular seat belt use?: yes Hospital stays in the last year:: no  Cognitive Assessment Difficulty concentrating, remembering, or making decisions? : no Will 6CIT or Mini Cog be Completed: yes What year is it?: 0 points What month is it?: 0 points Give patient an address phrase to remember (5 components): Madison Mason [address] About what time is  it?: 0 points Count backwards from 20 to 1: 0 points Say the months of the year in reverse: 0 points Repeat the  address phrase from earlier: 2 points 6 CIT Score: 2 points  Advance Directives (For Healthcare) Does Patient Have a Medical Advance Directive?: No Would patient like information on creating a medical advance directive?: No - Patient declined  Reviewed/Updated  Reviewed/Updated: Reviewed All (Medical, Surgical, Family, Medications, Allergies, Care Teams, Patient Goals)    Allergies (verified) Sulfa antibiotics   Current Medications (verified) Outpatient Encounter Medications as of 12/28/2023  Medication Sig   Blood Glucose Monitoring Suppl DEVI 1 each by Does not apply route daily before breakfast. May substitute to any manufacturer covered by patient's insurance.   clobetasol  cream (TEMOVATE ) 0.05 % Apply 1 Application topically 2 (two) times daily.   doxycycline  (VIBRA -TABS) 100 MG tablet Take 1 tablet (100 mg total) by mouth 2 (two) times daily.   Glucose Blood (BLOOD GLUCOSE TEST STRIPS) STRP 1 each by In Vitro route daily before breakfast. May substitute to any manufacturer covered by patient's insurance.   Lancets (ONETOUCH DELICA PLUS LANCET33G) MISC USE LANCET TO CHECK BLOOD SUGAR ONCE DAILY   lisinopril -hydrochlorothiazide  (ZESTORETIC ) 20-12.5 MG tablet Take 1 tablet by mouth daily.   metFORMIN  (GLUCOPHAGE ) 1000 MG tablet Take 1 tablet (1,000 mg total) by mouth 2 (two) times daily with a meal.   Misc Natural Products (OSTEO BI-FLEX ADV TRIPLE ST) TABS Take 1 tablet by mouth daily.   MULTIPLE VITAMINS-MINERALS PO Take 1 tablet by mouth daily.    rosuvastatin  (CRESTOR ) 20 MG tablet Take 1 tablet (20 mg total) by mouth daily.   tirzepatide  (MOUNJARO ) 7.5 MG/0.5ML Pen Inject 7.5 mg into the skin once a week.   [DISCONTINUED] calcipotriene  (DOVONOX) 0.005 % cream APPLY TO AFFECTED AREA ON FOOT TWICE DAILY   [DISCONTINUED] clobetasol  cream (TEMOVATE ) 0.05 % Apply 1 Application topically 2 (two) times daily.   [DISCONTINUED] clotrimazole -betamethasone  (LOTRISONE ) cream Apply 1  Application topically daily.   [DISCONTINUED] terbinafine  (LAMISIL ) 250 MG tablet Take 1 tablet (250 mg total) by mouth daily.   No facility-administered encounter medications on file as of 12/28/2023.    History: Past Medical History:  Diagnosis Date   Allergy 11/1981   Arthritis    Diabetes mellitus without complication (HCC) ?   GERD (gastroesophageal reflux disease)    Hyperlipidemia    Past Surgical History:  Procedure Laterality Date   CESAREAN SECTION  1989 and 1986   JOINT REPLACEMENT  03/16/2019 & 07/06/2019   hip replacement   Family History  Problem Relation Age of Onset   Breast cancer Mother        20   Diabetes Mother    Cancer Father        lung   Heart disease Father    Breast cancer Sister        25   Healthy Sister    Healthy Sister    Breast cancer Maternal Aunt    Breast cancer Maternal Aunt    Social History   Occupational History   Not on file  Tobacco Use   Smoking status: Former    Current packs/day: 0.00    Average packs/day: 1 pack/day for 25.0 years (25.0 ttl pk-yrs)    Types: Cigarettes    Start date: 01/06/1972    Quit date: 01/05/1997    Years since quitting: 26.9   Smokeless tobacco: Never   Tobacco comments:    Quit in 1988  Substance and  Sexual Activity   Alcohol use: Not Currently    Comment: glass of wine 4 or 5 glasses in a year   Drug use: No   Sexual activity: Yes    Birth control/protection: None   Tobacco Counseling Counseling given: Not Answered Tobacco comments: Quit in 1988  SDOH Screenings   Food Insecurity: No Food Insecurity (12/25/2023)  Housing: Low Risk (12/25/2023)  Transportation Needs: No Transportation Needs (12/25/2023)  Alcohol Screen: Low Risk (12/25/2023)  Depression (PHQ2-9): Low Risk (06/19/2023)  Financial Resource Strain: Low Risk (12/25/2023)  Physical Activity: Sufficiently Active (12/25/2023)  Social Connections: Socially Integrated (12/25/2023)  Stress: No Stress Concern Present  (12/25/2023)  Tobacco Use: Medium Risk (12/28/2023)   See flowsheets for full screening details  Depression Screen PHQ 2 & 9 Depression Scale- Over the past 2 weeks, how often have you been bothered by any of the following problems? Little interest or pleasure in doing things: 0 Feeling down, depressed, or hopeless (PHQ Adolescent also includes...irritable): 0 PHQ-2 Total Score: 0 Trouble falling or staying asleep, or sleeping too much: 3 Feeling tired or having little energy: 0 Poor appetite or overeating (PHQ Adolescent also includes...weight loss): 0 Feeling bad about yourself - or that you are a failure or have let yourself or your family down: 0 Trouble concentrating on things, such as reading the newspaper or watching television (PHQ Adolescent also includes...like school work): 0 Moving or speaking so slowly that other people could have noticed. Or the opposite - being so fidgety or restless that you have been moving around a lot more than usual: 0 Thoughts that you would be better off dead, or of hurting yourself in some way: 0 PHQ-9 Total Score: 3 If you checked off any problems, how difficult have these problems made it for you to do your work, take care of things at home, or get along with other people?: Not difficult at all     Goals Addressed   None         Objective:    Today's Vitals   12/28/23 1001  BP: 111/61  Pulse: 75  Temp: 97.7 F (36.5 C)  TempSrc: Oral  SpO2: 99%  Weight: 184 lb 11.2 oz (83.8 kg)  Height: 5' 6 (1.676 m)   Body mass index is 29.81 kg/m.  Hearing/Vision screen No results found. Immunizations and Health Maintenance Health Maintenance  Topic Date Due   Medicare Annual Wellness (AWV)  Never done   COVID-19 Vaccine (1) Never done   Zoster Vaccines- Shingrix (1 of 2) Never done   HEMOGLOBIN A1C  12/19/2023   Pneumococcal Vaccine: 50+ Years (1 of 2 - PCV) 06/18/2024 (Originally 12/09/1974)   Bone Density Scan  06/18/2024 (Originally  12/10/2022)   Diabetic kidney evaluation - eGFR measurement  06/18/2024   Diabetic kidney evaluation - Urine ACR  06/18/2024   FOOT EXAM  06/18/2024   OPHTHALMOLOGY EXAM  11/01/2024   Fecal DNA (Cologuard)  12/16/2024   Mammogram  12/20/2025   DTaP/Tdap/Td (3 - Td or Tdap) 11/21/2027   Hepatitis C Screening  Completed   Meningococcal B Vaccine  Aged Out        Assessment/Plan:  This is a routine wellness examination for Madison Mason.   Medicare Annual Wellness Visit Initial Medicare wellness visit. No issues with vision, hearing, cognition, mobility, or daily activities. Discussed shingles vaccine timing and CDC guidelines. - Consider shingles vaccine, plan timing to minimize side effects. - Continue Weight Watchers program and low-fat diet. - Encouraged use  of Habitrol for additional exercise options.  Type 2 diabetes mellitus with stage 2 chronic kidney disease Blood sugars generally well-controlled with occasional lows. Continues Mounjaro  and Weight Watchers. No thyroid issues. - Continue monitoring blood sugars regularly. - Continue Mounjaro  and Weight Watchers program. - Encouraged low-fat diet to manage gastrointestinal side effects of Mounjaro .  Hyponatremia Previous low sodium levels. Increasing salt intake has been challenging. - Continue to monitor sodium levels regularly. - Encouraged dietary salt intake as tolerated.   Patient Care Team: Donzella Lauraine SAILOR, DO as PCP - General (Family Medicine)  I have personally reviewed and noted the following in the patients chart:   Medical and social history Use of alcohol, tobacco or illicit drugs  Current medications and supplements including opioid prescriptions. Functional ability and status Nutritional status Physical activity Advanced directives List of other physicians Hospitalizations, surgeries, and ER visits in previous 12 months Vitals Screenings to include cognitive, depression, and falls Referrals and  appointments  No orders of the defined types were placed in this encounter.  In addition, I have reviewed and discussed with patient certain preventive protocols, quality metrics, and best practice recommendations. A written personalized care plan for preventive services as well as general preventive health recommendations were provided to patient.   Anastacia Reinecke N Colton Engdahl, DO   12/28/2023   No follow-ups on file.  After Visit Summary: (In Person-Declined) Patient declined AVS at this time.   "

## 2023-12-28 NOTE — Patient Instructions (Addendum)
 Recommended vaccine to get at pharmacy: Shingrix (for shingles). __________________________________________________________________  Increase your intake of fresh/frozen fruits and vegetables.  The Mediterranean diet is a great reference for meal ideas.  I strongly encourage you to incorporate exercise into your daily routine (at least 30 minutes most days of the week) and monitor your dietary intake. - I encourage you to measure/weigh your foods/beverages for a few days to get a more accurate idea of what different amounts of things look like on your plate or in your glass.  - Using smaller plates/glasses also helps in that it tricks our minds into thinking we've eaten more than we have. Studies have shown that we eat more when presented with larger plates, even with the same amount of food on them.   - Plan to eat until you are no longer hungry, rather than until you're full.  If you don't normally exercise, start with something simple or more enjoyable. You can plan to walk for your half hour or do something like dancing if you enjoy it.  Build up your activity over time; this will make it more enjoyable and reduce your risk of injury.  Here are some videos which offer a variety of different workout classes with different levels: [email or web address].[email or web address]  It is completely free. If a full video is too much for you to do, you can do them in bite-sized chunks (just pausing when needed and resuming later in the day).   Even if these actions don't ultimately lead to weight loss, increasing your activity will still help to reduce your insulin resistance and will improve your cardiovascular health (making heart attack/stroke less likely as you age).

## 2023-12-29 LAB — COMPREHENSIVE METABOLIC PANEL WITH GFR
ALT: 17 IU/L (ref 0–32)
AST: 25 IU/L (ref 0–40)
Albumin: 4.8 g/dL (ref 3.9–4.9)
Alkaline Phosphatase: 76 IU/L (ref 49–135)
BUN/Creatinine Ratio: 12 (ref 12–28)
BUN: 10 mg/dL (ref 8–27)
Bilirubin Total: 0.5 mg/dL (ref 0.0–1.2)
CO2: 23 mmol/L (ref 20–29)
Calcium: 10.2 mg/dL (ref 8.7–10.3)
Chloride: 85 mmol/L — ABNORMAL LOW (ref 96–106)
Creatinine, Ser: 0.83 mg/dL (ref 0.57–1.00)
Globulin, Total: 2.5 g/dL (ref 1.5–4.5)
Glucose: 83 mg/dL (ref 70–99)
Potassium: 4.9 mmol/L (ref 3.5–5.2)
Sodium: 127 mmol/L — ABNORMAL LOW (ref 134–144)
Total Protein: 7.3 g/dL (ref 6.0–8.5)
eGFR: 78 mL/min/1.73

## 2023-12-29 LAB — LIPID PANEL
Chol/HDL Ratio: 2.3 ratio (ref 0.0–4.4)
Cholesterol, Total: 179 mg/dL (ref 100–199)
HDL: 77 mg/dL
LDL Chol Calc (NIH): 78 mg/dL (ref 0–99)
Triglycerides: 140 mg/dL (ref 0–149)
VLDL Cholesterol Cal: 24 mg/dL (ref 5–40)

## 2023-12-29 LAB — HEMOGLOBIN A1C
Est. average glucose Bld gHb Est-mCnc: 114 mg/dL
Hgb A1c MFr Bld: 5.6 % (ref 4.8–5.6)

## 2024-01-11 ENCOUNTER — Ambulatory Visit: Payer: Self-pay | Admitting: Family Medicine

## 2024-01-11 DIAGNOSIS — E878 Other disorders of electrolyte and fluid balance, not elsewhere classified: Secondary | ICD-10-CM

## 2024-01-11 DIAGNOSIS — E871 Hypo-osmolality and hyponatremia: Secondary | ICD-10-CM

## 2024-01-19 ENCOUNTER — Other Ambulatory Visit: Payer: Self-pay

## 2024-01-19 DIAGNOSIS — E1169 Type 2 diabetes mellitus with other specified complication: Secondary | ICD-10-CM

## 2024-01-19 MED ORDER — LISINOPRIL 40 MG PO TABS: 40.0000 mg | ORAL_TABLET | Freq: Every day | ORAL | 3 refills | Status: DC

## 2024-01-19 MED ORDER — LISINOPRIL 40 MG PO TABS: 40.0000 mg | ORAL_TABLET | Freq: Every day | ORAL | 3 refills | Status: AC

## 2024-01-26 ENCOUNTER — Other Ambulatory Visit: Payer: Self-pay | Admitting: Family Medicine

## 2024-01-26 DIAGNOSIS — E1122 Type 2 diabetes mellitus with diabetic chronic kidney disease: Secondary | ICD-10-CM

## 2024-02-15 ENCOUNTER — Ambulatory Visit

## 2024-06-28 ENCOUNTER — Encounter
